# Patient Record
Sex: Female | Born: 1980 | Race: Black or African American | Hispanic: No | Marital: Single | Smoking: Never smoker
Health system: Southern US, Community
[De-identification: ages and names within clinical notes are randomized; demographics above are authoritative.]

## PROBLEM LIST (undated history)

## (undated) DIAGNOSIS — E669 Obesity, unspecified: Secondary | ICD-10-CM

## (undated) DIAGNOSIS — E079 Disorder of thyroid, unspecified: Secondary | ICD-10-CM

## (undated) DIAGNOSIS — D219 Benign neoplasm of connective and other soft tissue, unspecified: Secondary | ICD-10-CM

## (undated) DIAGNOSIS — N719 Inflammatory disease of uterus, unspecified: Secondary | ICD-10-CM

## (undated) DIAGNOSIS — J40 Bronchitis, not specified as acute or chronic: Secondary | ICD-10-CM

## (undated) HISTORY — PX: ABLATION: SHX5711

## (undated) HISTORY — PX: DILATION AND CURETTAGE OF UTERUS: SHX78

## (undated) HISTORY — PX: EYE SURGERY: SHX253

---

## 2001-01-09 ENCOUNTER — Other Ambulatory Visit: Admission: RE | Admit: 2001-01-09 | Discharge: 2001-01-09 | Payer: Self-pay | Admitting: Obstetrics and Gynecology

## 2001-03-06 ENCOUNTER — Encounter: Payer: Self-pay | Admitting: Obstetrics and Gynecology

## 2001-03-06 ENCOUNTER — Ambulatory Visit (HOSPITAL_COMMUNITY): Admission: RE | Admit: 2001-03-06 | Discharge: 2001-03-06 | Payer: Self-pay | Admitting: Obstetrics and Gynecology

## 2001-05-15 ENCOUNTER — Inpatient Hospital Stay (HOSPITAL_COMMUNITY): Admission: AD | Admit: 2001-05-15 | Discharge: 2001-05-15 | Payer: Self-pay | Admitting: Obstetrics and Gynecology

## 2001-08-03 ENCOUNTER — Encounter: Payer: Self-pay | Admitting: Obstetrics and Gynecology

## 2001-08-03 ENCOUNTER — Observation Stay (HOSPITAL_COMMUNITY): Admission: AD | Admit: 2001-08-03 | Discharge: 2001-08-04 | Payer: Self-pay | Admitting: Obstetrics and Gynecology

## 2001-08-04 ENCOUNTER — Encounter: Payer: Self-pay | Admitting: Obstetrics and Gynecology

## 2001-08-07 ENCOUNTER — Inpatient Hospital Stay (HOSPITAL_COMMUNITY): Admission: AD | Admit: 2001-08-07 | Discharge: 2001-08-07 | Payer: Self-pay | Admitting: Obstetrics and Gynecology

## 2001-08-10 ENCOUNTER — Inpatient Hospital Stay (HOSPITAL_COMMUNITY): Admission: AD | Admit: 2001-08-10 | Discharge: 2001-08-12 | Payer: Self-pay | Admitting: Obstetrics and Gynecology

## 2011-03-02 ENCOUNTER — Emergency Department (HOSPITAL_BASED_OUTPATIENT_CLINIC_OR_DEPARTMENT_OTHER)
Admission: EM | Admit: 2011-03-02 | Discharge: 2011-03-02 | Disposition: A | Discharge status: Home / Self Care | Payer: Self-pay | Attending: Emergency Medicine | Admitting: Emergency Medicine

## 2011-03-02 ENCOUNTER — Encounter: Payer: Self-pay | Admitting: Emergency Medicine

## 2011-03-02 ENCOUNTER — Emergency Department (INDEPENDENT_AMBULATORY_CARE_PROVIDER_SITE_OTHER): Payer: Self-pay

## 2011-03-02 DIAGNOSIS — J329 Chronic sinusitis, unspecified: Secondary | ICD-10-CM | POA: Insufficient documentation

## 2011-03-02 DIAGNOSIS — J3489 Other specified disorders of nose and nasal sinuses: Secondary | ICD-10-CM | POA: Insufficient documentation

## 2011-03-02 DIAGNOSIS — R509 Fever, unspecified: Secondary | ICD-10-CM | POA: Insufficient documentation

## 2011-03-02 DIAGNOSIS — R0989 Other specified symptoms and signs involving the circulatory and respiratory systems: Secondary | ICD-10-CM

## 2011-03-02 DIAGNOSIS — R05 Cough: Secondary | ICD-10-CM | POA: Insufficient documentation

## 2011-03-02 DIAGNOSIS — Z79899 Other long term (current) drug therapy: Secondary | ICD-10-CM | POA: Insufficient documentation

## 2011-03-02 DIAGNOSIS — R059 Cough, unspecified: Secondary | ICD-10-CM | POA: Insufficient documentation

## 2011-03-02 HISTORY — DX: Bronchitis, not specified as acute or chronic: J40

## 2011-03-02 MED ORDER — AMOXICILLIN 500 MG PO CAPS: 1000.0000 mg | ORAL_CAPSULE | Freq: Three times a day (TID) | ORAL | Status: AC

## 2011-03-02 NOTE — ED Notes (Signed)
Pt reports body aches, head ache, fever, chills and cough

## 2011-03-02 NOTE — ED Notes (Signed)
Pt. In no distress.

## 2011-03-03 NOTE — ED Provider Notes (Signed)
History     CSN: [id number] Arrival date & time: 03/02/2011  9:38 PM   First MD Initiated Contact with Patient 03/02/11 2220      Chief Complaint  Patient presents with  . Cough  . Nasal Congestion  . Headache  . Fever    (Consider location/radiation/quality/duration/timing/severity/associated sxs/prior treatment) HPI Comments: Patient presents with 3 weeks of cough, congestion, body aches, subjective fevers and chills. She does have facial pain, rhinorrhea, sore throat, body aches and diffuse headache. She denies any documented fevers, nausea, vomiting, abdominal pain. She denies any breath but this has chest pain with coughing. She's had multiple sick contacts at work. Taking over-the-counter cold and cough remedies without relief.  The history is provided by the patient.    Past Medical History  Diagnosis Date  . Bronchitis     Past Surgical History  Procedure Date  . Eye surgery     No family history on file.  History  Substance Use Topics  . Smoking status: Never Smoker   . Smokeless tobacco: Not on file  . Alcohol Use: No    OB History    Grav Para Term Preterm Abortions TAB SAB Ect Mult Living                  Review of Systems  Constitutional: Positive for fever.  Respiratory: Positive for cough.   Neurological: Positive for headaches.  All other systems reviewed and are negative.    Allergies  Review of patient's allergies indicates no known allergies.  Home Medications   Current Outpatient Rx  Name Route Sig Dispense Refill  . DEXTROMETHORPHAN-GUAIFENESIN 10-200 MG PO CAPS Oral Take 2 capsules by mouth every 6 (six) hours as needed. For congestion     . DEXTROMETHORPHAN-GUAIFENESIN 30-600 MG PO TB12 Oral Take 1 tablet by mouth every 12 (twelve) hours.      Marland Kitchen DIPHENHYDRAMINE HCL 25 MG PO TABS Oral Take 50 mg by mouth every 6 (six) hours as needed. For congestion     . PSEUDOEPHEDRINE HCL 30 MG PO TABS Oral Take 30 mg by mouth every 4 (four)  hours as needed. For congestion     . AMOXICILLIN 500 MG PO CAPS Oral Take 2 capsules (1,000 mg total) by mouth 3 (three) times daily. 60 capsule 0    BP 128/81  Pulse 97  Temp(Src) 99 F (37.2 C) (Oral)  Resp 18  Wt 250 lb (113.399 kg)  SpO2 98%  Physical Exam  Constitutional: She is oriented to person, place, and time. She appears well-developed and well-nourished. No distress.  HENT:  Head: Normocephalic and atraumatic.  Right Ear: External ear normal.  Left Ear: External ear normal.  Mouth/Throat: Oropharynx is clear and moist. No oropharyngeal exudate.       Maxillary and frontal sinus pain  Eyes: Conjunctivae are normal. Pupils are equal, round, and reactive to light.  Neck: Normal range of motion. Neck supple.       No meningismus  Cardiovascular: Normal rate, regular rhythm and normal heart sounds.   Pulmonary/Chest: Effort normal and breath sounds normal. No respiratory distress.  Abdominal: Soft. There is no tenderness. There is no rebound and no guarding.  Musculoskeletal: Normal range of motion. She exhibits no edema and no tenderness.  Neurological: She is alert and oriented to person, place, and time. No cranial nerve deficit.  Skin: Skin is warm.    ED Course  Procedures (including critical care time)   Labs Reviewed  RAPID STREP  SCREEN   Dg Chest 2 View  03/02/2011  *RADIOLOGY REPORT*  Clinical Data: Cough and congestion.  CHEST - 2 VIEW  Comparison: None.  Findings: The lungs are well-aerated and clear.  There is no evidence of focal opacification, pleural effusion or pneumothorax.  The heart is normal in size; the mediastinal contour is within normal limits.  No acute osseous abnormalities are seen.  IMPRESSION: No acute cardiopulmonary process seen.  Original Report Authenticated By: Tonia Ghent, M.D.     1. Sinusitis       MDM  Cough, congestion, facial pain subjective fever and chills for 3 weeks. Patient is nontoxic-appearing and exam but  does have congestion and voice change.   Given length of illness we'll treat for sinusitis. Stable for outpatient followup.        Glynn Octave, MD 03/03/11 0030

## 2014-12-11 ENCOUNTER — Emergency Department (HOSPITAL_BASED_OUTPATIENT_CLINIC_OR_DEPARTMENT_OTHER): Payer: Self-pay

## 2014-12-11 ENCOUNTER — Emergency Department (HOSPITAL_BASED_OUTPATIENT_CLINIC_OR_DEPARTMENT_OTHER)
Admission: EM | Admit: 2014-12-11 | Discharge: 2014-12-12 | Disposition: A | Discharge status: Home / Self Care | Payer: Self-pay | Attending: Emergency Medicine | Admitting: Emergency Medicine

## 2014-12-11 ENCOUNTER — Encounter (HOSPITAL_BASED_OUTPATIENT_CLINIC_OR_DEPARTMENT_OTHER): Payer: Self-pay | Admitting: Emergency Medicine

## 2014-12-11 DIAGNOSIS — S76112A Strain of left quadriceps muscle, fascia and tendon, initial encounter: Secondary | ICD-10-CM

## 2014-12-11 DIAGNOSIS — Y9389 Activity, other specified: Secondary | ICD-10-CM | POA: Insufficient documentation

## 2014-12-11 DIAGNOSIS — Y9289 Other specified places as the place of occurrence of the external cause: Secondary | ICD-10-CM | POA: Insufficient documentation

## 2014-12-11 DIAGNOSIS — Y998 Other external cause status: Secondary | ICD-10-CM | POA: Insufficient documentation

## 2014-12-11 DIAGNOSIS — Z8709 Personal history of other diseases of the respiratory system: Secondary | ICD-10-CM | POA: Insufficient documentation

## 2014-12-11 DIAGNOSIS — E669 Obesity, unspecified: Secondary | ICD-10-CM | POA: Insufficient documentation

## 2014-12-11 DIAGNOSIS — S73102A Unspecified sprain of left hip, initial encounter: Secondary | ICD-10-CM | POA: Insufficient documentation

## 2014-12-11 DIAGNOSIS — X58XXXA Exposure to other specified factors, initial encounter: Secondary | ICD-10-CM | POA: Insufficient documentation

## 2014-12-11 HISTORY — DX: Obesity, unspecified: E66.9

## 2014-12-11 NOTE — ED Notes (Signed)
Pt c/o left knee pain that started suddenly when getting out of car 45 min PTA

## 2014-12-11 NOTE — ED Notes (Signed)
C/o left knee pain onset getting out of car,  Hx of old knee inj to same leg

## 2014-12-12 MED ORDER — HYDROCODONE-ACETAMINOPHEN 5-325 MG PO TABS: 1.0000 | ORAL_TABLET | Freq: Four times a day (QID) | ORAL | Status: DC | PRN

## 2014-12-12 MED ORDER — NAPROXEN 250 MG PO TABS
500.0000 mg | ORAL_TABLET | Freq: Once | ORAL | Status: AC
  Administered 2014-12-12: 500 mg via ORAL
  Filled 2014-12-12: qty 2

## 2014-12-12 MED ORDER — HYDROCODONE-ACETAMINOPHEN 5-325 MG PO TABS
1.0000 | ORAL_TABLET | Freq: Once | ORAL | Status: AC
  Administered 2014-12-12: 1 via ORAL
  Filled 2014-12-12: qty 1

## 2014-12-12 MED ORDER — NAPROXEN SODIUM 275 MG PO TABS
ORAL_TABLET | ORAL | Status: DC
Start: 2014-12-12 — End: 2016-10-17

## 2014-12-12 NOTE — ED Provider Notes (Signed)
CSN: [id number]     Arrival date & time 12/11/14  2229 History   First MD Initiated Contact with Patient 12/12/14 [phone]     Chief Complaint  Patient presents with  . Knee Pain     (Consider location/radiation/quality/duration/timing/severity/associated sxs/prior Treatment) HPI  This is a 34 year old female who had the sudden onset of pain in her left knee and she was getting out of a car about 45 minutes prior to arrival. She felt no pop and there was no known trauma. The pain worsened and is now moderate to severe, especially when attempting to ambulate. The pain is located over the patella and distal quadriceps. There is no deformity or significant swelling associated. She denies other injury.  Past Medical History  Diagnosis Date  . Bronchitis   . Obesity    Past Surgical History  Procedure Laterality Date  . Eye surgery     No family history on file. Social History  Substance Use Topics  . Smoking status: Never Smoker   . Smokeless tobacco: None  . Alcohol Use: No   OB History    No data available     Review of Systems  All other systems reviewed and are negative.   Allergies  Review of patient's allergies indicates no known allergies.  Home Medications   Prior to Admission medications   Medication Sig Start Date End Date Taking? Authorizing Provider  Dextromethorphan-Guaifenesin (ALKA-SELTZER PLUS MUCUS & CONG) 10-200 MG CAPS Take 2 capsules by mouth every 6 (six) hours as needed. For congestion     Historical Provider, MD  dextromethorphan-guaiFENesin (MUCINEX DM) 30-600 MG per 12 hr tablet Take 1 tablet by mouth every 12 (twelve) hours.      Historical Provider, MD  diphenhydrAMINE (BENADRYL) 25 MG tablet Take 50 mg by mouth every 6 (six) hours as needed. For congestion     Historical Provider, MD  pseudoephedrine (SUDAFED) 30 MG tablet Take 30 mg by mouth every 4 (four) hours as needed. For congestion     Historical Provider, MD   BP 134/69 mmHg  Pulse 88   Temp(Src) 98.1 F (36.7 C) (Oral)  Resp 18  Ht 5' 8.5" (1.74 m)  Wt 250 lb (113.399 kg)  BMI 37.46 kg/m2  SpO2 96%   Physical Exam  General: Well-developed, well-nourished female in no acute distress; appearance consistent with age of record HENT: normocephalic; atraumatic Eyes: pupils equal, round and reactive to light; extraocular muscles intact Neck: supple Heart: regular rate and rhythm Lungs: clear to auscultation bilaterally Abdomen: soft; nondistended Extremities: No deformity; full range of motion except left knee; pulses normal; tenderness of left patella and distal quadriceps without swelling, ecchymosis or erythema; left quadriceps tendon function intact; left lower extremity distally neurovascularly intact Neurologic: Awake, alert and oriented; motor function intact in all extremities and symmetric; no facial droop Skin: Warm and dry Psychiatric: Normal mood and affect    ED Course  Procedures (including critical care time)   MDM  Nursing notes and vitals signs, including pulse oximetry, reviewed.  Summary of this visit's results, reviewed by myself:  Imaging Studies: Dg Knee Complete 4 Views Left  12/11/2014   CLINICAL DATA:  34 year old female with left knee pain  EXAM: LEFT KNEE - COMPLETE 4+ VIEW  COMPARISON:  None.  FINDINGS: There is no evidence of acute fracture, dislocation, or joint effusion. Small bony fragment adjacent to the intercondylar eminence is likely chronic. Clinical correlation is recommended. There is no evidence of arthropathy or other focal  bone abnormality. Soft tissues are unremarkable.  IMPRESSION: No acute fracture or dislocation. Small bony fragment adjacent to the intercondylar eminence of the tibia is likely chronic.   Electronically Signed   By: Anner Crete M.D.   On: 12/11/2014 23:29       Shanon Rosser, MD 12/12/14 [phone]

## 2015-02-16 ENCOUNTER — Emergency Department (HOSPITAL_BASED_OUTPATIENT_CLINIC_OR_DEPARTMENT_OTHER)
Admission: EM | Admit: 2015-02-16 | Discharge: 2015-02-16 | Disposition: A | Discharge status: Home / Self Care | Payer: Self-pay | Attending: Emergency Medicine | Admitting: Emergency Medicine

## 2015-02-16 ENCOUNTER — Encounter (HOSPITAL_BASED_OUTPATIENT_CLINIC_OR_DEPARTMENT_OTHER): Payer: Self-pay | Admitting: Emergency Medicine

## 2015-02-16 ENCOUNTER — Emergency Department (HOSPITAL_BASED_OUTPATIENT_CLINIC_OR_DEPARTMENT_OTHER): Payer: Self-pay

## 2015-02-16 DIAGNOSIS — Y998 Other external cause status: Secondary | ICD-10-CM | POA: Insufficient documentation

## 2015-02-16 DIAGNOSIS — Y9289 Other specified places as the place of occurrence of the external cause: Secondary | ICD-10-CM | POA: Insufficient documentation

## 2015-02-16 DIAGNOSIS — X58XXXA Exposure to other specified factors, initial encounter: Secondary | ICD-10-CM | POA: Insufficient documentation

## 2015-02-16 DIAGNOSIS — Z8709 Personal history of other diseases of the respiratory system: Secondary | ICD-10-CM | POA: Insufficient documentation

## 2015-02-16 DIAGNOSIS — E669 Obesity, unspecified: Secondary | ICD-10-CM | POA: Insufficient documentation

## 2015-02-16 DIAGNOSIS — Y9389 Activity, other specified: Secondary | ICD-10-CM | POA: Insufficient documentation

## 2015-02-16 DIAGNOSIS — Z79899 Other long term (current) drug therapy: Secondary | ICD-10-CM | POA: Insufficient documentation

## 2015-02-16 DIAGNOSIS — S93402A Sprain of unspecified ligament of left ankle, initial encounter: Secondary | ICD-10-CM | POA: Insufficient documentation

## 2015-02-16 NOTE — ED Notes (Signed)
Denies needs or questions, declines w/c

## 2015-02-16 NOTE — ED Notes (Signed)
EDPA into room, at BS.  

## 2015-02-16 NOTE — ED Notes (Signed)
Pt in c/o L ankle pain after twisting it today taking clothes into the house. Several injuries to the same ankle. Moderate swelling noted, pt ambulatory with steady gait.

## 2015-02-16 NOTE — ED Provider Notes (Signed)
CSN: [id number]     Arrival date & time 02/16/15  2133 History   First MD Initiated Contact with Patient 02/16/15 2142     Chief Complaint  Patient presents with  . Ankle Pain     (Consider location/radiation/quality/duration/timing/severity/associated sxs/prior Treatment) HPI  Blood pressure 112/93, pulse 88, temperature 98.1 F (36.7 C), temperature source Oral, resp. rate 18, height 5\' 8"  (1.727 m), weight 280 lb (127.007 kg), SpO2 98 %.  Destiny Hudson is a 34 y.o. female complaining of pain to left lateral ankle after she rolled it while carrying laundry into the house earlier in the day. Patient has a history of severe ankle sprain to the affected ankle, she sprained her knee on the left leg several months ago. She rates the pain as moderate to severe, 7 out of 10, no pain medication taken prior to arrival. She is able to bear weight on the ankle. She denies numbness, weakness. She never had surgery to the affected joint, does not follow with orthopedics.   Past Medical History  Diagnosis Date  . Bronchitis   . Obesity    Past Surgical History  Procedure Laterality Date  . Eye surgery     History reviewed. No pertinent family history. Social History  Substance Use Topics  . Smoking status: Never Smoker   . Smokeless tobacco: None  . Alcohol Use: No   OB History    No data available     Review of Systems  10 systems reviewed and found to be negative, except as noted in the HPI.  Allergies  Review of patient's allergies indicates no known allergies.  Home Medications   Prior to Admission medications   Medication Sig Start Date End Date Taking? Authorizing Provider  Dextromethorphan-Guaifenesin (ALKA-SELTZER PLUS MUCUS & CONG) 10-200 MG CAPS Take 2 capsules by mouth every 6 (six) hours as needed. For congestion     Historical Provider, MD  dextromethorphan-guaiFENesin (MUCINEX DM) 30-600 MG per 12 hr tablet Take 1 tablet by mouth every 12 (twelve) hours.       Historical Provider, MD  diphenhydrAMINE (BENADRYL) 25 MG tablet Take 50 mg by mouth every 6 (six) hours as needed. For congestion     Historical Provider, MD  HYDROcodone-acetaminophen (NORCO) 5-325 MG per tablet Take 1-2 tablets by mouth every 6 (six) hours as needed (for pain). 12/12/14   John Molpus, MD  naproxen sodium (ANAPROX) 275 MG tablet Take 1 tablet twice daily as needed for knee pain. Best taken with a meal. 12/12/14   Shanon Rosser, MD  pseudoephedrine (SUDAFED) 30 MG tablet Take 30 mg by mouth every 4 (four) hours as needed. For congestion     Historical Provider, MD   BP 112/93 mmHg  Pulse 88  Temp(Src) 98.1 F (36.7 C) (Oral)  Resp 18  Ht 5\' 8"  (1.727 m)  Wt 280 lb (127.007 kg)  BMI 42.58 kg/m2  SpO2 98% Physical Exam  Constitutional: She is oriented to person, place, and time. She appears well-developed and well-nourished. No distress.  HENT:  Head: Normocephalic.  Eyes: Conjunctivae and EOM are normal.  Cardiovascular: Normal rate.   Pulmonary/Chest: Effort normal. No stridor.  Musculoskeletal: Normal range of motion. She exhibits edema and tenderness.  Left ankle: No deformity, no overlying skin changes, mild swelling and tenderness to palpation along the inferior, lateral malleolus. No bony tenderness palpation, distally neurovascularly intact.   Neurological: She is alert and oriented to person, place, and time.  Psychiatric: She has a normal  mood and affect.  Nursing note and vitals reviewed.   ED Course  Procedures (including critical care time) Labs Review Labs Reviewed - No data to display  Imaging Review Dg Ankle Complete Left  02/16/2015  CLINICAL DATA:  Turned left ankle, with diffuse pain and swelling. Initial encounter. EXAM: LEFT ANKLE COMPLETE - 3+ VIEW COMPARISON:  Left ankle radiographs performed 08/03/2001 FINDINGS: There is no evidence of fracture or dislocation. The ankle mortise is intact; the interosseous space is within normal limits. No talar  tilt or subluxation is seen. Small plantar and posterior calcaneal spurs are seen. The joint spaces are preserved. Mild diffuse soft tissue swelling is noted about the ankle. IMPRESSION: No evidence of fracture or dislocation. Electronically Signed   By: Garald Balding M.D.   On: 02/16/2015 22:28   I have personally reviewed and evaluated these images and lab results as part of my medical decision-making.   EKG Interpretation None      MDM   Final diagnoses:  Left ankle sprain, initial encounter    Filed Vitals:   02/16/15 2141  BP: 112/93  Pulse: 88  Temp: 98.1 F (36.7 C)  TempSrc: Oral  Resp: 18  Height: 5\' 8"  (1.727 m)  Weight: 280 lb (127.007 kg)  SpO2: 98%    Destiny Hudson is 34 y.o. female presenting with left ankle pain. Neurovascularly intact with mild tenderness to palpation. X-rays negative. Will treat with rest, ice, compression elevation, NSAIDS. Patient has crutches at home and given an orthopedic follow-up.   Evaluation does not show pathology that would require ongoing emergent intervention or inpatient treatment. Pt is hemodynamically stable and mentating appropriately. Discussed findings and plan with patient/guardian, who agrees with care plan. All questions answered. Return precautions discussed and outpatient follow up given.       Monico Blitz, PA-C 02/16/15 [place], DO 02/16/15 2250

## 2015-02-16 NOTE — Discharge Instructions (Signed)
Use your crutches  Rest, Ice intermittently (in the first 24-48 hours), Gentle compression with an Ace wrap, and elevate (Limb above the level of the heart)   Take up to 800mg  of ibuprofen (that is usually 4 over the counter pills)  3 times a day for 5 days. Take with food.  Please follow with your primary care doctor in the next 2 days for a check-up. They must obtain records for further management.   Do not hesitate to return to the Emergency Department for any new, worsening or concerning symptoms.    Ankle Sprain An ankle sprain is an injury to the strong, fibrous tissues (ligaments) that hold the bones of your ankle joint together.  CAUSES An ankle sprain is usually caused by a fall or by twisting your ankle. Ankle sprains most commonly occur when you step on the outer edge of your foot, and your ankle turns inward. People who participate in sports are more prone to these types of injuries.  SYMPTOMS   Pain in your ankle. The pain may be present at rest or only when you are trying to stand or walk.  Swelling.  Bruising. Bruising may develop immediately or within 1 to 2 days after your injury.  Difficulty standing or walking, particularly when turning corners or changing directions. DIAGNOSIS  Your caregiver will ask you details about your injury and perform a physical exam of your ankle to determine if you have an ankle sprain. During the physical exam, your caregiver will press on and apply pressure to specific areas of your foot and ankle. Your caregiver will try to move your ankle in certain ways. An X-ray exam may be done to be sure a bone was not broken or a ligament did not separate from one of the bones in your ankle (avulsion fracture).  TREATMENT  Certain types of braces can help stabilize your ankle. Your caregiver can make a recommendation for this. Your caregiver may recommend the use of medicine for pain. If your sprain is severe, your caregiver may refer you to a surgeon  who helps to restore function to parts of your skeletal system (orthopedist) or a physical therapist. [place] ice to your injury for 1-2 days or as directed by your caregiver. Applying ice helps to reduce inflammation and pain.  Put ice in a plastic bag.  Place a towel between your skin and the bag.  Leave the ice on for 15-20 minutes at a time, every 2 hours while you are awake.  Only take over-the-counter or prescription medicines for pain, discomfort, or fever as directed by your caregiver.  Elevate your injured ankle above the level of your heart as much as possible for 2-3 days.  If your caregiver recommends crutches, use them as instructed. Gradually put weight on the affected ankle. Continue to use crutches or a cane until you can walk without feeling pain in your ankle.  If you have a plaster splint, wear the splint as directed by your caregiver. Do not rest it on anything harder than a pillow for the first 24 hours. Do not put weight on it. Do not get it wet. You may take it off to take a shower or bath.  You may have been given an elastic bandage to wear around your ankle to provide support. If the elastic bandage is too tight (you have numbness or tingling in your foot or your foot becomes cold and blue), adjust the bandage to make it  comfortable.  If you have an air splint, you may blow more air into it or let air out to make it more comfortable. You may take your splint off at night and before taking a shower or bath. Wiggle your toes in the splint several times per day to decrease swelling. SEEK MEDICAL CARE IF:   You have rapidly increasing bruising or swelling.  Your toes feel extremely cold or you lose feeling in your foot.  Your pain is not relieved with medicine. SEEK IMMEDIATE MEDICAL CARE IF:  Your toes are numb or blue.  You have severe pain that is increasing. MAKE SURE YOU:   Understand these instructions.  Will watch your  condition.  Will get help right away if you are not doing well or get worse.   This information is not intended to replace advice given to you by your health care provider. Make sure you discuss any questions you have with your health care provider.   Document Released: 03/22/2005 Document Revised: 04/12/2014 Document Reviewed: 04/03/2011 Elsevier Interactive Patient Education [employer].

## 2016-04-06 ENCOUNTER — Encounter (HOSPITAL_BASED_OUTPATIENT_CLINIC_OR_DEPARTMENT_OTHER): Payer: Self-pay | Admitting: Emergency Medicine

## 2016-04-06 ENCOUNTER — Emergency Department (HOSPITAL_BASED_OUTPATIENT_CLINIC_OR_DEPARTMENT_OTHER)
Admission: EM | Admit: 2016-04-06 | Discharge: 2016-04-07 | Disposition: A | Discharge status: Home / Self Care | Payer: PRIVATE HEALTH INSURANCE | Attending: Emergency Medicine | Admitting: Emergency Medicine

## 2016-04-06 DIAGNOSIS — N730 Acute parametritis and pelvic cellulitis: Secondary | ICD-10-CM

## 2016-04-06 DIAGNOSIS — R102 Pelvic and perineal pain: Secondary | ICD-10-CM | POA: Diagnosis present

## 2016-04-06 DIAGNOSIS — N739 Female pelvic inflammatory disease, unspecified: Secondary | ICD-10-CM | POA: Diagnosis not present

## 2016-04-06 HISTORY — DX: Inflammatory disease of uterus, unspecified: N71.9

## 2016-04-06 NOTE — ED Triage Notes (Signed)
Pt c/o pelvic pain and was dx with endometritis today at MD office. Pt states she has since been vomiting and having pelvic pain.

## 2016-04-06 NOTE — ED Provider Notes (Signed)
[facility] Provider Note   CSN: [id number] Arrival date & time: 04/06/16  1957   By signing my name below, I, Delton Prairie, attest that this documentation has been prepared under the direction and in the presence of Tanna Furry, MD  Electronically Signed: Delton Prairie, ED Scribe. 04/06/16. 12:10 AM.   History   Chief Complaint Chief Complaint  Patient presents with  . Pelvic Pain   The history is provided by the patient. No language interpreter was used.   HPI Comments:  Destiny Hudson is a 36 y.o. female who presents to the Emergency Department complaining of persistent lower pelvic pain x 2 days. Pt states her pain radiates down her bilateral lower extremities. She also reports nausea and vomiting. Her pain is worse upon palpation and with ambulation. Pt was seen on 04/07/15, was diagnosed with endometritis and prescribed antibiotics and tramadol. Pt denies any other associated symptoms and any other modifying factors at this time.   PCP: Lynder Parents, MD   Past Medical History:  Diagnosis Date  . Bronchitis   . Endometritis   . Obesity     There are no active problems to display for this patient.   Past Surgical History:  Procedure Laterality Date  . EYE SURGERY      OB History    No data available       Home Medications    Prior to Admission medications   Medication Sig Start Date End Date Taking? Authorizing Provider  Dextromethorphan-Guaifenesin (ALKA-SELTZER PLUS MUCUS & CONG) 10-200 MG CAPS Take 2 capsules by mouth every 6 (six) hours as needed. For congestion     Historical Provider, MD  dextromethorphan-guaiFENesin (MUCINEX DM) 30-600 MG per 12 hr tablet Take 1 tablet by mouth every 12 (twelve) hours.      Historical Provider, MD  diphenhydrAMINE (BENADRYL) 25 MG tablet Take 50 mg by mouth every 6 (six) hours as needed. For congestion     Historical Provider, MD  HYDROcodone-acetaminophen (NORCO) 5-325 MG per tablet Take 1-2 tablets by mouth  every 6 (six) hours as needed (for pain). 12/12/14   John Molpus, MD  naproxen sodium (ANAPROX) 275 MG tablet Take 1 tablet twice daily as needed for knee pain. Best taken with a meal. 12/12/14   John Molpus, MD  ondansetron (ZOFRAN ODT) 4 MG disintegrating tablet Take 1 tablet (4 mg total) by mouth every 8 (eight) hours as needed for nausea. 04/07/16   Tanna Furry, MD  pseudoephedrine (SUDAFED) 30 MG tablet Take 30 mg by mouth every 4 (four) hours as needed. For congestion     Historical Provider, MD    Family History No family history on file.  Social History Social History  Substance Use Topics  . Smoking status: Never Smoker  . Smokeless tobacco: Not on file  . Alcohol use No     Allergies   Patient has no known allergies.   Review of Systems Review of Systems  Constitutional: Negative for appetite change, chills, diaphoresis, fatigue and fever.  HENT: Negative for mouth sores, sore throat and trouble swallowing.   Eyes: Negative for visual disturbance.  Respiratory: Negative for cough, chest tightness, shortness of breath and wheezing.   Cardiovascular: Negative for chest pain.  Gastrointestinal: Positive for abdominal pain, nausea and vomiting. Negative for abdominal distention and diarrhea.  Endocrine: Negative for polydipsia, polyphagia and polyuria.  Genitourinary: Positive for pelvic pain. Negative for dysuria, frequency and hematuria.  Musculoskeletal: Positive for myalgias. Negative for gait problem.  Skin: Negative for color change, pallor and rash.  Neurological: Negative for dizziness, syncope, light-headedness and headaches.  Hematological: Does not bruise/bleed easily.  Psychiatric/Behavioral: Negative for behavioral problems and confusion.   Physical Exam Updated Vital Signs BP 135/80   Pulse 94   Temp 98.2 F (36.8 C) (Oral)   Resp 18   Ht 5\' 8"  (1.727 m)   Wt 290 lb (131.5 kg)   SpO2 96%   BMI 44.09 kg/m   Physical Exam  Constitutional: She is oriented  to person, place, and time. She appears well-developed and well-nourished. No distress.  HENT:  Head: Normocephalic.  Eyes: Conjunctivae are normal. Pupils are equal, round, and reactive to light. No scleral icterus.  Neck: Normal range of motion. Neck supple. No thyromegaly present.  Cardiovascular: Normal rate and regular rhythm.  Exam reveals no gallop and no friction rub.   No murmur heard. Pulmonary/Chest: Effort normal and breath sounds normal. No respiratory distress. She has no wheezes. She has no rales.  Abdominal: Soft. Bowel sounds are normal. She exhibits no distension. There is tenderness. There is no rebound.  Mild tenderness to suprapubic abdomen.   Musculoskeletal: Normal range of motion.  Neurological: She is alert and oriented to person, place, and time.  Skin: Skin is warm and dry. No rash noted.  Psychiatric: She has a normal mood and affect. Her behavior is normal.    ED Treatments / Results  DIAGNOSTIC STUDIES:  Oxygen Saturation is 99% on RA, normal by my interpretation.    COORDINATION OF CARE:  12:10 AM Discussed treatment plan with pt at bedside and pt agreed to plan.  Labs (all labs ordered are listed, but only abnormal results are displayed) Labs Reviewed - No data to display  EKG  EKG Interpretation None       Radiology No results found.  Procedures Procedures (including critical care time)  Medications Ordered in ED Medications  metroNIDAZOLE (FLAGYL) IVPB 500 mg (not administered)  morphine 4 MG/ML injection 4 mg (4 mg Intravenous Given 04/07/16 0042)  ondansetron (ZOFRAN) injection 4 mg (4 mg Intravenous Given 04/07/16 0042)  azithromycin (ZITHROMAX) 500 mg in dextrose 5 % 250 mL IVPB (0 mg Intravenous Stopped 04/07/16 0258)  sodium chloride 0.9 % bolus 1,000 mL (0 mLs Intravenous Stopped 04/07/16 0258)  azithromycin (ZITHROMAX) 500 MG injection (500 mg  Given 04/07/16 0042)     Initial Impression / Assessment and Plan / ED Course  I have  reviewed the triage vital signs and the nursing notes.  Pertinent labs & imaging results that were available during my care of the patient were reviewed by me and considered in my medical decision making (see chart for details).  Clinical Course     Given IV fluids. Given IV Flagyl and Zithromax. Take his by mouth liquids. Appropriate for discharge home. Continue her medications, doxycycline and Flagyl, as prescribed by her GYN. Zofran for nausea. Take medications with foods.  Final Clinical Impressions(s) / ED Diagnoses   Final diagnoses:  PID (acute pelvic inflammatory disease)    New Prescriptions New Prescriptions   ONDANSETRON (ZOFRAN ODT) 4 MG DISINTEGRATING TABLET    Take 1 tablet (4 mg total) by mouth every 8 (eight) hours as needed for nausea.  I personally performed the services described in this documentation, which was scribed in my presence. The recorded information has been reviewed and is accurate.     Tanna Furry, MD 04/07/16 [phone]

## 2016-04-06 NOTE — ED Notes (Signed)
Pt states she is unable to keep antibiotics down.

## 2016-04-07 MED ORDER — DEXTROSE 5 % IV SOLN
500.0000 mg | Freq: Once | INTRAVENOUS | Status: AC
  Administered 2016-04-07: 500 mg via INTRAVENOUS

## 2016-04-07 MED ORDER — MORPHINE SULFATE (PF) 4 MG/ML IV SOLN
4.0000 mg | INTRAVENOUS | Status: DC | PRN
Start: 2016-04-07 — End: 2016-04-07
  Administered 2016-04-07: 4 mg via INTRAVENOUS
  Filled 2016-04-07: qty 1

## 2016-04-07 MED ORDER — ONDANSETRON HCL 4 MG/2ML IJ SOLN
4.0000 mg | Freq: Once | INTRAMUSCULAR | Status: AC
  Administered 2016-04-07: 4 mg via INTRAVENOUS
  Filled 2016-04-07: qty 2

## 2016-04-07 MED ORDER — METRONIDAZOLE IN NACL 5-0.79 MG/ML-% IV SOLN
500.0000 mg | Freq: Once | INTRAVENOUS | Status: AC
  Administered 2016-04-07: 500 mg via INTRAVENOUS
  Filled 2016-04-07: qty 100

## 2016-04-07 MED ORDER — ONDANSETRON 4 MG PO TBDP: 4.0000 mg | ORAL_TABLET | Freq: Three times a day (TID) | ORAL | 0 refills | Status: DC | PRN

## 2016-04-07 MED ORDER — SODIUM CHLORIDE 0.9 % IV BOLUS (SEPSIS)
1000.0000 mL | Freq: Once | INTRAVENOUS | Status: AC
  Administered 2016-04-07: 1000 mL via INTRAVENOUS

## 2016-04-07 MED ORDER — AZITHROMYCIN 500 MG IV SOLR
INTRAVENOUS | Status: AC
  Administered 2016-04-07: 500 mg
  Filled 2016-04-07: qty 500

## 2016-04-07 NOTE — Discharge Instructions (Signed)
Continue your medications starting tomorrow as prescribed by your OB/GYN. Zofran for nausea. Absolutely no alcohol while taking Flagyl

## 2016-07-10 ENCOUNTER — Emergency Department (HOSPITAL_BASED_OUTPATIENT_CLINIC_OR_DEPARTMENT_OTHER): Payer: Self-pay

## 2016-07-10 ENCOUNTER — Encounter (HOSPITAL_BASED_OUTPATIENT_CLINIC_OR_DEPARTMENT_OTHER): Payer: Self-pay | Admitting: *Deleted

## 2016-07-10 ENCOUNTER — Emergency Department (HOSPITAL_BASED_OUTPATIENT_CLINIC_OR_DEPARTMENT_OTHER)
Admission: EM | Admit: 2016-07-10 | Discharge: 2016-07-10 | Disposition: A | Discharge status: Home / Self Care | Payer: Self-pay | Attending: Emergency Medicine | Admitting: Emergency Medicine

## 2016-07-10 DIAGNOSIS — N760 Acute vaginitis: Secondary | ICD-10-CM | POA: Insufficient documentation

## 2016-07-10 DIAGNOSIS — B9689 Other specified bacterial agents as the cause of diseases classified elsewhere: Secondary | ICD-10-CM

## 2016-07-10 DIAGNOSIS — E039 Hypothyroidism, unspecified: Secondary | ICD-10-CM | POA: Insufficient documentation

## 2016-07-10 DIAGNOSIS — R1032 Left lower quadrant pain: Secondary | ICD-10-CM

## 2016-07-10 HISTORY — DX: Disorder of thyroid, unspecified: E07.9

## 2016-07-10 LAB — BASIC METABOLIC PANEL
Anion gap: 7 (ref 5–15)
BUN: 9 mg/dL (ref 6–20)
CO2: 25 mmol/L (ref 22–32)
Calcium: 9 mg/dL (ref 8.9–10.3)
Chloride: 102 mmol/L (ref 101–111)
Creatinine, Ser: 0.95 mg/dL (ref 0.44–1.00)
GFR calc Af Amer: >60 mL/min (ref >60)
GFR calc non Af Amer: >60 mL/min (ref >60)
Glucose, Bld: 98 mg/dL (ref 65–99)
Potassium: 4.4 mmol/L (ref 3.5–5.1)
Sodium: 134 mmol/L — ABNORMAL LOW (ref 135–145)

## 2016-07-10 LAB — CBC WITH DIFFERENTIAL/PLATELET
Basophils Absolute: 0.1 10*3/uL (ref 0.0–0.1)
Basophils Relative: 0 %
Eosinophils Absolute: 0 10*3/uL (ref 0.0–0.7)
Eosinophils Relative: 0 %
HCT: 40.5 % (ref 36.0–46.0)
Hemoglobin: 13.4 g/dL (ref 12.0–15.0)
Lymphocytes Relative: 19 %
Lymphs Abs: 2.8 10*3/uL (ref 0.7–4.0)
MCH: 27 pg (ref 26.0–34.0)
MCHC: 33.1 g/dL (ref 30.0–36.0)
MCV: 81.5 fL (ref 78.0–100.0)
Monocytes Absolute: 0.8 10*3/uL (ref 0.1–1.0)
Monocytes Relative: 6 %
Neutro Abs: 10.8 10*3/uL — ABNORMAL HIGH (ref 1.7–7.7)
Neutrophils Relative %: 75 %
Platelets: 287 10*3/uL (ref 150–400)
RBC: 4.97 MIL/uL (ref 3.87–5.11)
RDW: 15 % (ref 11.5–15.5)
WBC: 14.6 10*3/uL — ABNORMAL HIGH (ref 4.0–10.5)

## 2016-07-10 LAB — URINALYSIS, ROUTINE W REFLEX MICROSCOPIC
BILIRUBIN URINE: NEGATIVE
GLUCOSE, UA: NEGATIVE mg/dL
HGB URINE DIPSTICK: NEGATIVE
KETONES UR: NEGATIVE mg/dL
LEUKOCYTES UA: NEGATIVE
Nitrite: NEGATIVE
PH: 7.5 (ref 5.0–8.0)
Protein, ur: NEGATIVE mg/dL
Specific Gravity, Urine: 1.021 (ref 1.005–1.030)

## 2016-07-10 LAB — WET PREP, GENITAL
Sperm: NONE SEEN
Trich, Wet Prep: NONE SEEN
Yeast Wet Prep HPF POC: NONE SEEN

## 2016-07-10 LAB — PREGNANCY, URINE: Preg Test, Ur: NEGATIVE

## 2016-07-10 MED ORDER — IBUPROFEN 800 MG PO TABS: 800.0000 mg | ORAL_TABLET | Freq: Three times a day (TID) | ORAL | 0 refills | Status: DC

## 2016-07-10 MED ORDER — METRONIDAZOLE 500 MG PO TABS: 500.0000 mg | ORAL_TABLET | Freq: Two times a day (BID) | ORAL | 0 refills | Status: DC

## 2016-07-10 NOTE — ED Provider Notes (Signed)
Patient care transferred at end of shift from [facility], PA-C pending U/S results. Plan is to discharge with BV treatment and follow-up with OB/GYN if ultrasound negative for abscess. [place] with acute abnormality. Patient was informed and advised to f/u with OB/Gyn and repeat imaging in 2-3 months as recommended by radiology. Patient was stable and ready to go home.   Emeline General, PA-C 07/10/16 [place], MD 07/10/16 [phone]

## 2016-07-10 NOTE — ED Provider Notes (Signed)
[facility] DEPT Provider Note   CSN: [id number] Arrival date & time: 07/10/16  1133     History   Chief Complaint Chief Complaint  Patient presents with  . Back Pain  . Abdominal Pain    HPI Destiny Hudson is a 36 y.o. female with history of uterine fibroids, endometritis who presents with intermittent left lower quadrant pain accompanied with vaginal bleeding for the past week. Patient has history of endometrial ablation had not had a period around a year. Patient began with spotting over the past week and had large clots yesterday. Patient had severe left lower quadrant pain at that time. When she has her pain, she has pain radiating to her left lower extremity and left side low back. She does not have any pain at this time. She denies any concern for STD exposure. She denies any other abnormal vaginal discharge or pelvic pain. Followed by an OB/GYN who has managed this in the past. Patient states her symptoms feel the same as in the past when she was diagnosed with uterine fibroids. Patient denies any fevers, chest pain, shortness of breath, other abdominal pain, nausea, vomiting, urinary symptoms.  HPI  Past Medical History:  Diagnosis Date  . Bronchitis   . Endometritis   . Obesity   . Thyroid disease    Hypo    There are no active problems to display for this patient.   Past Surgical History:  Procedure Laterality Date  . ABLATION     uterus  . DILATION AND CURETTAGE OF UTERUS    . EYE SURGERY      OB History    No data available       Home Medications    Prior to Admission medications   Medication Sig Start Date End Date Taking? Authorizing Provider  Levothyroxine Sodium (SYNTHROID PO) Take by mouth.   Yes Historical Provider, MD  Dextromethorphan-Guaifenesin (ALKA-SELTZER PLUS MUCUS & CONG) 10-200 MG CAPS Take 2 capsules by mouth every 6 (six) hours as needed. For congestion     Historical Provider, MD  dextromethorphan-guaiFENesin (MUCINEX DM) 30-600  MG per 12 hr tablet Take 1 tablet by mouth every 12 (twelve) hours.      Historical Provider, MD  diphenhydrAMINE (BENADRYL) 25 MG tablet Take 50 mg by mouth every 6 (six) hours as needed. For congestion     Historical Provider, MD  HYDROcodone-acetaminophen (NORCO) 5-325 MG per tablet Take 1-2 tablets by mouth every 6 (six) hours as needed (for pain). 12/12/14   John Molpus, MD  ibuprofen (ADVIL,MOTRIN) 800 MG tablet Take 1 tablet (800 mg total) by mouth 3 (three) times daily. 07/10/16   Frederica Kuster, PA-C  metroNIDAZOLE (FLAGYL) 500 MG tablet Take 1 tablet (500 mg total) by mouth 2 (two) times daily. 07/10/16   Frederica Kuster, PA-C  naproxen sodium (ANAPROX) 275 MG tablet Take 1 tablet twice daily as needed for knee pain. Best taken with a meal. 12/12/14   John Molpus, MD  ondansetron (ZOFRAN ODT) 4 MG disintegrating tablet Take 1 tablet (4 mg total) by mouth every 8 (eight) hours as needed for nausea. 04/07/16   Tanna Furry, MD  pseudoephedrine (SUDAFED) 30 MG tablet Take 30 mg by mouth every 4 (four) hours as needed. For congestion     Historical Provider, MD    Family History No family history on file.  Social History Social History  Substance Use Topics  . Smoking status: Never Smoker  . Smokeless tobacco: Never Used  .  Alcohol use No     Allergies   Patient has no known allergies.   Review of Systems Review of Systems  Constitutional: Negative for chills and fever.  HENT: Negative for facial swelling and sore throat.   Respiratory: Negative for shortness of breath.   Cardiovascular: Negative for chest pain.  Gastrointestinal: Positive for abdominal pain. Negative for nausea and vomiting.  Genitourinary: Positive for vaginal bleeding. Negative for dysuria, frequency, urgency, vaginal discharge and vaginal pain.  Musculoskeletal: Positive for back pain.  Skin: Negative for rash and wound.  Neurological: Negative for headaches.  Psychiatric/Behavioral: The patient is not  nervous/anxious.      Physical Exam Updated Vital Signs BP (!) 140/95 (BP Location: Right Arm)   Pulse 82   Temp 98.5 F (36.9 C) (Oral)   Resp 18   Ht 5\' 10"  (1.778 m)   Wt 129.3 kg   SpO2 100%   BMI 40.89 kg/m   Physical Exam  Constitutional: She appears well-developed and well-nourished. No distress.  HENT:  Head: Normocephalic and atraumatic.  Mouth/Throat: Oropharynx is clear and moist. No oropharyngeal exudate.  Eyes: Conjunctivae are normal. Pupils are equal, round, and reactive to light. Right eye exhibits no discharge. Left eye exhibits no discharge. No scleral icterus.  Neck: Normal range of motion. Neck supple. No thyromegaly present.  Cardiovascular: Normal rate, regular rhythm, normal heart sounds and intact distal pulses.  Exam reveals no gallop and no friction rub.   No murmur heard. Pulmonary/Chest: Effort normal and breath sounds normal. No stridor. No respiratory distress. She has no wheezes. She has no rales.  Abdominal: Soft. Bowel sounds are normal. She exhibits no distension. There is no tenderness. There is no rebound and no guarding.  Genitourinary: Uterus normal. Pelvic exam was performed with patient supine. Cervix exhibits no motion tenderness. Right adnexum displays no mass, no tenderness and no fullness. Left adnexum displays no mass, no tenderness and no fullness. Vaginal discharge (white/clear) found.  Genitourinary Comments: Chaperone present  Musculoskeletal: She exhibits no edema.  Lymphadenopathy:    She has no cervical adenopathy.  Neurological: She is alert. Coordination normal.  Skin: Skin is warm and dry. No rash noted. She is not diaphoretic. No pallor.  Psychiatric: She has a normal mood and affect.  Nursing note and vitals reviewed.    ED Treatments / Results  Labs (all labs ordered are listed, but only abnormal results are displayed) Labs Reviewed  WET PREP, GENITAL - Abnormal; Notable for the following:       Result Value    Clue Cells Wet Prep HPF POC PRESENT (*)    WBC, Wet Prep HPF POC MANY (*)    All other components within normal limits  CBC WITH DIFFERENTIAL/PLATELET - Abnormal; Notable for the following:    WBC 14.6 (*)    Neutro Abs 10.8 (*)    All other components within normal limits  BASIC METABOLIC PANEL - Abnormal; Notable for the following:    Sodium 134 (*)    All other components within normal limits  PREGNANCY, URINE  URINALYSIS, ROUTINE W REFLEX MICROSCOPIC  GC/CHLAMYDIA PROBE AMP () NOT AT [facility]    EKG  EKG Interpretation None       Radiology US Transvaginal Non-ob  Result Date: 07/10/2016 CLINICAL DATA:  Left lower quadrant pain for 1 week, intermittent with nausea. History of tubal ligation and endometrial a ablation knee in 2017. EXAM: TRANSABDOMINAL AND TRANSVAGINAL ULTRASOUND OF PELVIS DOPPLER ULTRASOUND OF OVARIES TECHNIQUE: Both  transabdominal and transvaginal ultrasound examinations of the pelvis were performed. Transabdominal technique was performed for global imaging of the pelvis including uterus, ovaries, adnexal regions, and pelvic cul-de-sac. It was necessary to proceed with endovaginal exam following the transabdominal exam to visualize the endometrial complex and adnexal structures to an adequate degree. Color and duplex Doppler ultrasound was utilized to evaluate blood flow to the ovaries. COMPARISON:  None. FINDINGS: Uterus Measurements: 10.8 x 5.9 x 8 cm. Heterogeneous echotexture throughout without discrete mass or focal lesion. Endometrium Thickness: Approximately 7 mm thickness. Irregular margins without discrete mass. At least some component of fluid and/or debris within the endometrial canal. Right ovary Measurements: 3.9 x 2.5 x 2.8 cm. Normal dominant follicle measuring 2.6 cm. Left ovary Measurements: 2.4 x 1.4 x 2.1 cm. Normal appearance/no adnexal mass. Pulsed Doppler evaluation of both ovaries demonstrates normal low-resistance arterial and venous  waveforms. Other findings No abnormal free fluid. IMPRESSION: 1. Endometrial complex with irregular margins, but without discrete mass, presumably related to history of previous endometrial ablation. Small amount of fluid and/or debris within the upper portion of the endometrial canal, again possibly residual from prior ablation. Overall endometrial thickness is within normal limits at 7 mm. Would consider follow-up pelvic ultrasound in 2-3 months to ensure stability. 2. Both ovaries appear normal and there is no mass or free fluid seen within either adnexal region. Electronically Signed   By: Franki Cabot M.D.   On: 07/10/2016 17:13   US Pelvis Complete  Result Date: 07/10/2016 CLINICAL DATA:  Left lower quadrant pain for 1 week, intermittent with nausea. History of tubal ligation and endometrial a ablation knee in 2017. EXAM: TRANSABDOMINAL AND TRANSVAGINAL ULTRASOUND OF PELVIS DOPPLER ULTRASOUND OF OVARIES TECHNIQUE: Both transabdominal and transvaginal ultrasound examinations of the pelvis were performed. Transabdominal technique was performed for global imaging of the pelvis including uterus, ovaries, adnexal regions, and pelvic cul-de-sac. It was necessary to proceed with endovaginal exam following the transabdominal exam to visualize the endometrial complex and adnexal structures to an adequate degree. Color and duplex Doppler ultrasound was utilized to evaluate blood flow to the ovaries. COMPARISON:  None. FINDINGS: Uterus Measurements: 10.8 x 5.9 x 8 cm. Heterogeneous echotexture throughout without discrete mass or focal lesion. Endometrium Thickness: Approximately 7 mm thickness. Irregular margins without discrete mass. At least some component of fluid and/or debris within the endometrial canal. Right ovary Measurements: 3.9 x 2.5 x 2.8 cm. Normal dominant follicle measuring 2.6 cm. Left ovary Measurements: 2.4 x 1.4 x 2.1 cm. Normal appearance/no adnexal mass. Pulsed Doppler evaluation of both ovaries  demonstrates normal low-resistance arterial and venous waveforms. Other findings No abnormal free fluid. IMPRESSION: 1. Endometrial complex with irregular margins, but without discrete mass, presumably related to history of previous endometrial ablation. Small amount of fluid and/or debris within the upper portion of the endometrial canal, again possibly residual from prior ablation. Overall endometrial thickness is within normal limits at 7 mm. Would consider follow-up pelvic ultrasound in 2-3 months to ensure stability. 2. Both ovaries appear normal and there is no mass or free fluid seen within either adnexal region. Electronically Signed   By: Franki Cabot M.D.   On: 07/10/2016 17:13   [place] Art/ven Flow Abd Pelv Doppler  Result Date: 07/10/2016 CLINICAL DATA:  Left lower quadrant pain for 1 week, intermittent with nausea. History of tubal ligation and endometrial a ablation knee in 2017. EXAM: TRANSABDOMINAL AND TRANSVAGINAL ULTRASOUND OF PELVIS DOPPLER ULTRASOUND OF OVARIES TECHNIQUE: Both transabdominal and transvaginal ultrasound examinations  of the pelvis were performed. Transabdominal technique was performed for global imaging of the pelvis including uterus, ovaries, adnexal regions, and pelvic cul-de-sac. It was necessary to proceed with endovaginal exam following the transabdominal exam to visualize the endometrial complex and adnexal structures to an adequate degree. Color and duplex Doppler ultrasound was utilized to evaluate blood flow to the ovaries. COMPARISON:  None. FINDINGS: Uterus Measurements: 10.8 x 5.9 x 8 cm. Heterogeneous echotexture throughout without discrete mass or focal lesion. Endometrium Thickness: Approximately 7 mm thickness. Irregular margins without discrete mass. At least some component of fluid and/or debris within the endometrial canal. Right ovary Measurements: 3.9 x 2.5 x 2.8 cm. Normal dominant follicle measuring 2.6 cm. Left ovary Measurements: 2.4 x 1.4 x 2.1 cm. Normal  appearance/no adnexal mass. Pulsed Doppler evaluation of both ovaries demonstrates normal low-resistance arterial and venous waveforms. Other findings No abnormal free fluid. IMPRESSION: 1. Endometrial complex with irregular margins, but without discrete mass, presumably related to history of previous endometrial ablation. Small amount of fluid and/or debris within the upper portion of the endometrial canal, again possibly residual from prior ablation. Overall endometrial thickness is within normal limits at 7 mm. Would consider follow-up pelvic ultrasound in 2-3 months to ensure stability. 2. Both ovaries appear normal and there is no mass or free fluid seen within either adnexal region. Electronically Signed   By: Franki Cabot M.D.   On: 07/10/2016 17:13    Procedures Procedures (including critical care time)  Medications Ordered in ED Medications - No data to display   Initial Impression / Assessment and Plan / ED Course  I have reviewed the triage vital signs and the nursing notes.  Pertinent labs & imaging results that were available during my care of the patient were reviewed by me and considered in my medical decision making (see chart for details).     Patient with recurrent pelvic pain. Pelvic ultrasound shows [1. Endometrial complex with irregular margins, but without discrete mass, presumably related to history of previous endometrial ablation. Small amount of fluid and/or debris within the upper  portion of the endometrial canal, again possibly residual from prior ablation. Overall endometrial thickness is within normal limits at 7 mm. Would consider follow-up pelvic ultrasound in 2-3 months to ensure stability. 2. Both ovaries appear normal and there is no mass or free fluid seen within either adnexal region.] CBC shows WBC 14.6, which could be reactive from severe pain episode prior to blood draw, no abscess found on ultrasound. BMP shows sodium 134. Urine negative, urine pregnancy  negative. Wet prep shows clue cells and many WBCs. GC/Chlamydia sent. HIV and syphilis declined by patient. We'll treat with metronidazole with follow-up to OB/GYN this week. We'll also treat pain with ibuprofen. Return precautions discussed. Patient understands and agrees with plan. Patient vitals stable throughout ED course and discharged in satisfactory condition.  Final Clinical Impressions(s) / ED Diagnoses   Final diagnoses:  LLQ pain  Bacterial vaginosis    New Prescriptions Discharge Medication List as of 07/10/2016  5:22 PM    START taking these medications   Details  ibuprofen (ADVIL,MOTRIN) 800 MG tablet Take 1 tablet (800 mg total) by mouth 3 (three) times daily., Starting Sat 07/10/2016, Print    metroNIDAZOLE (FLAGYL) 500 MG tablet Take 1 tablet (500 mg total) by mouth 2 (two) times daily., Starting Sat 07/10/2016, Print         Frederica Kuster, PA-C 07/11/16 0932    Alfonzo Beers, MD 07/11/16 [phone]

## 2016-07-10 NOTE — ED Notes (Signed)
Attempted to get vitals. Pt in ultra sound. Will attempt again when pt returns.

## 2016-07-10 NOTE — ED Triage Notes (Signed)
Patient states she has a one week history of RLQ abdominal pain with radiation into the right hip and thigh.  States the pain starts in her lower back and radiates into RLQ of abdomen.  History uterine ablation and fibroids.

## 2016-07-10 NOTE — Discharge Instructions (Addendum)
Medications: ibuprofen, Flagyl  Treatment: Take ibuprofen every 8 hours as needed for your pain. You can alternate with Tylenol as prescribed over-the-counter. Take Flagyl twice daily for a week. Make sure not to drink alcohol with this medication.  Follow-up: Please follow-up with your OB/GYN this is possible for further evaluation and treatment of your symptoms. Please return to the emergency department if you develop any new or worsening symptoms.  Radiology report from pelvic US: Would consider follow-up pelvic ultrasound in 2-3 months to  ensure stability.

## 2016-07-10 NOTE — ED Notes (Signed)
Pt discharged to home with family. NAD.  

## 2016-07-12 LAB — GC/CHLAMYDIA PROBE AMP (~~LOC~~) NOT AT ARMC
Chlamydia: NEGATIVE
Neisseria Gonorrhea: NEGATIVE

## 2016-10-17 ENCOUNTER — Encounter (HOSPITAL_BASED_OUTPATIENT_CLINIC_OR_DEPARTMENT_OTHER): Payer: Self-pay | Admitting: *Deleted

## 2016-10-17 ENCOUNTER — Emergency Department (HOSPITAL_BASED_OUTPATIENT_CLINIC_OR_DEPARTMENT_OTHER)
Admission: EM | Admit: 2016-10-17 | Discharge: 2016-10-17 | Disposition: A | Discharge status: Home / Self Care | Payer: PRIVATE HEALTH INSURANCE | Attending: Emergency Medicine | Admitting: Emergency Medicine

## 2016-10-17 DIAGNOSIS — R102 Pelvic and perineal pain: Secondary | ICD-10-CM

## 2016-10-17 DIAGNOSIS — N946 Dysmenorrhea, unspecified: Secondary | ICD-10-CM | POA: Insufficient documentation

## 2016-10-17 DIAGNOSIS — E039 Hypothyroidism, unspecified: Secondary | ICD-10-CM | POA: Insufficient documentation

## 2016-10-17 LAB — BASIC METABOLIC PANEL
ANION GAP: 9 (ref 5–15)
BUN: 8 mg/dL (ref 6–20)
CO2: 24 mmol/L (ref 22–32)
Calcium: 8.7 mg/dL — ABNORMAL LOW (ref 8.9–10.3)
Chloride: 103 mmol/L (ref 101–111)
Creatinine, Ser: 0.95 mg/dL (ref 0.44–1.00)
GLUCOSE: 122 mg/dL — AB (ref 65–99)
POTASSIUM: 3.8 mmol/L (ref 3.5–5.1)
Sodium: 136 mmol/L (ref 135–145)

## 2016-10-17 LAB — CBC WITH DIFFERENTIAL/PLATELET
BASOS ABS: 0 10*3/uL (ref 0.0–0.1)
BASOS PCT: 0 %
Eosinophils Absolute: 0.1 10*3/uL (ref 0.0–0.7)
Eosinophils Relative: 1 %
HEMATOCRIT: 38.1 % (ref 36.0–46.0)
Hemoglobin: 12.5 g/dL (ref 12.0–15.0)
LYMPHS PCT: 16 %
Lymphs Abs: 2.1 10*3/uL (ref 0.7–4.0)
MCH: 26.8 pg (ref 26.0–34.0)
MCHC: 32.8 g/dL (ref 30.0–36.0)
MCV: 81.8 fL (ref 78.0–100.0)
Monocytes Absolute: 0.7 10*3/uL (ref 0.1–1.0)
Monocytes Relative: 5 %
NEUTROS ABS: 9.9 10*3/uL — AB (ref 1.7–7.7)
Neutrophils Relative %: 78 %
PLATELETS: 218 10*3/uL (ref 150–400)
RBC: 4.66 MIL/uL (ref 3.87–5.11)
RDW: 14.6 % (ref 11.5–15.5)
WBC: 12.8 10*3/uL — AB (ref 4.0–10.5)

## 2016-10-17 LAB — URINALYSIS, ROUTINE W REFLEX MICROSCOPIC
BILIRUBIN URINE: NEGATIVE
GLUCOSE, UA: NEGATIVE mg/dL
Ketones, ur: 15 mg/dL — AB
Nitrite: NEGATIVE
PH: 7 (ref 5.0–8.0)
PROTEIN: NEGATIVE mg/dL
Specific Gravity, Urine: 1.018 (ref 1.005–1.030)

## 2016-10-17 LAB — URINALYSIS, MICROSCOPIC (REFLEX)

## 2016-10-17 LAB — PREGNANCY, URINE: PREG TEST UR: NEGATIVE

## 2016-10-17 MED ORDER — DOXYCYCLINE HYCLATE 100 MG PO CAPS: 100.0000 mg | ORAL_CAPSULE | Freq: Two times a day (BID) | ORAL | 0 refills | Status: DC

## 2016-10-17 MED ORDER — KETOROLAC TROMETHAMINE 30 MG/ML IJ SOLN
30.0000 mg | Freq: Once | INTRAMUSCULAR | Status: AC
  Administered 2016-10-17: 30 mg via INTRAVENOUS
  Filled 2016-10-17: qty 1

## 2016-10-17 MED ORDER — HYDROCODONE-ACETAMINOPHEN 5-325 MG PO TABS: 1.0000 | ORAL_TABLET | ORAL | 0 refills | Status: DC | PRN

## 2016-10-17 MED ORDER — HYDROMORPHONE HCL 1 MG/ML IJ SOLN
1.0000 mg | Freq: Once | INTRAMUSCULAR | Status: AC
  Administered 2016-10-17: 1 mg via INTRAVENOUS
  Filled 2016-10-17: qty 1

## 2016-10-17 MED ORDER — SODIUM CHLORIDE 0.9 % IV BOLUS (SEPSIS)
1000.0000 mL | Freq: Once | INTRAVENOUS | Status: AC
  Administered 2016-10-17: 1000 mL via INTRAVENOUS

## 2016-10-17 MED ORDER — ONDANSETRON HCL 4 MG/2ML IJ SOLN
4.0000 mg | Freq: Once | INTRAMUSCULAR | Status: AC
  Administered 2016-10-17: 4 mg via INTRAVENOUS
  Filled 2016-10-17: qty 2

## 2016-10-17 MED ORDER — METRONIDAZOLE 500 MG PO TABS: 500.0000 mg | ORAL_TABLET | Freq: Three times a day (TID) | ORAL | 0 refills | Status: DC

## 2016-10-17 NOTE — ED Triage Notes (Signed)
C/o left lower quad that started yesterday. States she has a hx of fibroids and this happens when she has her menstrual cycle. States she had an ablation for her periods but states she recently started having them again. Denies any diarrhea. C/o vomiting 3-4 times in the last 2 hours. Describes pain as sharp that's comes and goes.

## 2016-10-17 NOTE — ED Provider Notes (Signed)
[facility] Provider Note   CSN: [id number] Arrival date & time: 10/17/16  0115     History   Chief Complaint Chief Complaint  Patient presents with  . Abdominal Pain    HPI Destiny Hudson is a 36 y.o. female.  Patient presents to the ER for evaluation of left lower abdomen and pelvic pain. Symptoms ongoing for 2 days. Patient reports that she has had recurrent symptoms of pelvic pain, nausea, vomiting with her menstrual cycle over the last year or so. She had previously had an endometrial ablation ceasing her menstruation, but over the last year and started having bleeding again with the symptoms. Her OB/GYN doctor, Dr. Shelly Flatten has discussed the possibility of hysterectomy with her.      Past Medical History:  Diagnosis Date  . Bronchitis   . Endometritis   . Obesity   . Thyroid disease    Hypo    There are no active problems to display for this patient.   Past Surgical History:  Procedure Laterality Date  . ABLATION     uterus  . DILATION AND CURETTAGE OF UTERUS    . EYE SURGERY      OB History    No data available       Home Medications    Prior to Admission medications   Medication Sig Start Date End Date Taking? Authorizing Provider  TRAMADOL HCL ER PO Take by mouth.   Yes [provider]  doxycycline (VIBRAMYCIN) 100 MG capsule Take 1 capsule (100 mg total) by mouth 2 (two) times daily. 10/17/16   Orpah Greek, MD  HYDROcodone-acetaminophen (NORCO/VICODIN) 5-325 MG tablet Take 1-2 tablets by mouth every 4 (four) hours as needed for moderate pain. 10/17/16   Orpah Greek, MD  metroNIDAZOLE (FLAGYL) 500 MG tablet Take 1 tablet (500 mg total) by mouth 3 (three) times daily. 10/17/16   Orpah Greek, MD    Family History No family history on file.  Social History Social History  Substance Use Topics  . Smoking status: Never Smoker  . Smokeless tobacco: Never Used  . Alcohol use No     Allergies     Patient has no known allergies.   Review of Systems Review of Systems  Gastrointestinal: Positive for nausea and vomiting.  Genitourinary: Positive for pelvic pain.  All other systems reviewed and are negative.    Physical Exam Updated Vital Signs BP 137/87   Pulse 85   Temp 98 F (36.7 C) (Oral)   Resp (!) 22   Ht 5\' 10"  (1.778 m)   Wt 117.9 kg (260 lb)   SpO2 92%   BMI 37.31 kg/m   Physical Exam  Constitutional: She is oriented to person, place, and time. She appears well-developed and well-nourished. No distress.  HENT:  Head: Normocephalic and atraumatic.  Right Ear: Hearing normal.  Left Ear: Hearing normal.  Nose: Nose normal.  Mouth/Throat: Oropharynx is clear and moist and mucous membranes are normal.  Eyes: Pupils are equal, round, and reactive to light. Conjunctivae and EOM are normal.  Neck: Normal range of motion. Neck supple.  Cardiovascular: Regular rhythm, S1 normal and S2 normal.  Exam reveals no gallop and no friction rub.   No murmur heard. Pulmonary/Chest: Effort normal and breath sounds normal. No respiratory distress. She exhibits no tenderness.  Abdominal: Soft. Normal appearance and bowel sounds are normal. There is no hepatosplenomegaly. There is tenderness in the left lower quadrant. There is no rebound, no  guarding, no tenderness at McBurney's point and negative Murphy's sign. No hernia.  Musculoskeletal: Normal range of motion.  Neurological: She is alert and oriented to person, place, and time. She has normal strength. No cranial nerve deficit or sensory deficit. Coordination normal. GCS eye subscore is 4. GCS verbal subscore is 5. GCS motor subscore is 6.  Skin: Skin is warm, dry and intact. No rash noted. No cyanosis.  Psychiatric: She has a normal mood and affect. Her speech is normal and behavior is normal. Thought content normal.  Nursing note and vitals reviewed.    ED Treatments / Results  Labs (all labs ordered are listed, but only  abnormal results are displayed) Labs Reviewed  URINALYSIS, ROUTINE W REFLEX MICROSCOPIC - Abnormal; Notable for the following:       Result Value   APPearance CLOUDY (*)    Hgb urine dipstick LARGE (*)    Ketones, ur 15 (*)    Leukocytes, UA TRACE (*)    All other components within normal limits  CBC WITH DIFFERENTIAL/PLATELET - Abnormal; Notable for the following:    WBC 12.8 (*)    Neutro Abs 9.9 (*)    All other components within normal limits  BASIC METABOLIC PANEL - Abnormal; Notable for the following:    Glucose, Bld 122 (*)    Calcium 8.7 (*)    All other components within normal limits  URINALYSIS, MICROSCOPIC (REFLEX) - Abnormal; Notable for the following:    Bacteria, UA RARE (*)    Squamous Epithelial / LPF 0-5 (*)    All other components within normal limits  PREGNANCY, URINE    EKG  EKG Interpretation None       Radiology No results found.  Procedures Procedures (including critical care time)  Medications Ordered in ED Medications  sodium chloride 0.9 % bolus 1,000 mL (1,000 mLs Intravenous New Bag/Given 10/17/16 0204)  HYDROmorphone (DILAUDID) injection 1 mg (1 mg Intravenous Given 10/17/16 0204)  ondansetron (ZOFRAN) injection 4 mg (4 mg Intravenous Given 10/17/16 0204)     Initial Impression / Assessment and Plan / ED Course  I have reviewed the triage vital signs and the nursing notes.  Pertinent labs & imaging results that were available during my care of the patient were reviewed by me and considered in my medical decision making (see chart for details).     Patient presents to the emergency department for evaluation of pelvic pain. Patient has been seen multiple times in this ER and by her OB/GYN for similar symptoms. She has left lower quadrant and pelvic pain similar to prior examinations. She has had nausea and vomiting similar to prior presentations as well. Examination is not consistent with peritonitis. Patient to minister to IV fluids,  antiemetics and pain medication. Her OB/GYN history her for endometritis in the past for similar presentation. Will repeat the Flagyl and doxycycline that she has been given in the past, analgesia, prompt follow-up with OB/GYN. As she has had identical presentations in the past, no imaging is necessary.  Final Clinical Impressions(s) / ED Diagnoses   Final diagnoses:  Pelvic pain  Dysmenorrhea    New Prescriptions New Prescriptions   DOXYCYCLINE (VIBRAMYCIN) 100 MG CAPSULE    Take 1 capsule (100 mg total) by mouth 2 (two) times daily.   HYDROCODONE-ACETAMINOPHEN (NORCO/VICODIN) 5-325 MG TABLET    Take 1-2 tablets by mouth every 4 (four) hours as needed for moderate pain.   METRONIDAZOLE (FLAGYL) 500 MG TABLET    Take 1  tablet (500 mg total) by mouth 3 (three) times daily.     Orpah Greek, MD 10/17/16 [phone]

## 2016-10-17 NOTE — ED Notes (Signed)
Pt's ride arrived

## 2016-12-21 ENCOUNTER — Emergency Department (HOSPITAL_BASED_OUTPATIENT_CLINIC_OR_DEPARTMENT_OTHER)
Admission: EM | Admit: 2016-12-21 | Discharge: 2016-12-22 | Disposition: A | Discharge status: Home / Self Care | Payer: Self-pay | Attending: Emergency Medicine | Admitting: Emergency Medicine

## 2016-12-21 ENCOUNTER — Encounter (HOSPITAL_BASED_OUTPATIENT_CLINIC_OR_DEPARTMENT_OTHER): Payer: Self-pay

## 2016-12-21 DIAGNOSIS — Z5321 Procedure and treatment not carried out due to patient leaving prior to being seen by health care provider: Secondary | ICD-10-CM | POA: Insufficient documentation

## 2016-12-21 DIAGNOSIS — R109 Unspecified abdominal pain: Secondary | ICD-10-CM | POA: Insufficient documentation

## 2016-12-21 HISTORY — DX: Benign neoplasm of connective and other soft tissue, unspecified: D21.9

## 2016-12-21 NOTE — ED Triage Notes (Signed)
C/o LLQ pain x today-hx of same pain r/t to fibroids-NAD-steady gait

## 2016-12-22 ENCOUNTER — Emergency Department (HOSPITAL_BASED_OUTPATIENT_CLINIC_OR_DEPARTMENT_OTHER)
Admission: EM | Admit: 2016-12-22 | Discharge: 2016-12-22 | Disposition: A | Discharge status: Home / Self Care | Payer: Self-pay | Attending: Emergency Medicine | Admitting: Emergency Medicine

## 2016-12-22 ENCOUNTER — Encounter (HOSPITAL_BASED_OUTPATIENT_CLINIC_OR_DEPARTMENT_OTHER): Payer: Self-pay | Admitting: Emergency Medicine

## 2016-12-22 DIAGNOSIS — R1032 Left lower quadrant pain: Secondary | ICD-10-CM | POA: Insufficient documentation

## 2016-12-22 DIAGNOSIS — B9689 Other specified bacterial agents as the cause of diseases classified elsewhere: Secondary | ICD-10-CM

## 2016-12-22 DIAGNOSIS — N760 Acute vaginitis: Secondary | ICD-10-CM | POA: Insufficient documentation

## 2016-12-22 DIAGNOSIS — E039 Hypothyroidism, unspecified: Secondary | ICD-10-CM | POA: Insufficient documentation

## 2016-12-22 LAB — CBC WITH DIFFERENTIAL/PLATELET
BASOS ABS: 0 10*3/uL (ref 0.0–0.1)
Basophils Relative: 0 %
Eosinophils Absolute: 0.1 10*3/uL (ref 0.0–0.7)
Eosinophils Relative: 1 %
HCT: 37.1 % (ref 36.0–46.0)
HEMOGLOBIN: 12.1 g/dL (ref 12.0–15.0)
LYMPHS PCT: 22 %
Lymphs Abs: 2.3 10*3/uL (ref 0.7–4.0)
MCH: 26.7 pg (ref 26.0–34.0)
MCHC: 32.6 g/dL (ref 30.0–36.0)
MCV: 81.9 fL (ref 78.0–100.0)
Monocytes Absolute: 0.9 10*3/uL (ref 0.1–1.0)
Monocytes Relative: 9 %
NEUTROS ABS: 7.2 10*3/uL (ref 1.7–7.7)
Neutrophils Relative %: 68 %
PLATELETS: 200 10*3/uL (ref 150–400)
RBC: 4.53 MIL/uL (ref 3.87–5.11)
RDW: 14.9 % (ref 11.5–15.5)
WBC: 10.5 10*3/uL (ref 4.0–10.5)

## 2016-12-22 LAB — URINALYSIS, MICROSCOPIC (REFLEX)

## 2016-12-22 LAB — COMPREHENSIVE METABOLIC PANEL
ALBUMIN: 3.6 g/dL (ref 3.5–5.0)
ALT: 11 U/L — ABNORMAL LOW (ref 14–54)
ANION GAP: 6 (ref 5–15)
AST: 15 U/L (ref 15–41)
Alkaline Phosphatase: 98 U/L (ref 38–126)
BILIRUBIN TOTAL: 0.6 mg/dL (ref 0.3–1.2)
BUN: 8 mg/dL (ref 6–20)
CO2: 25 mmol/L (ref 22–32)
Calcium: 8.6 mg/dL — ABNORMAL LOW (ref 8.9–10.3)
Chloride: 104 mmol/L (ref 101–111)
Creatinine, Ser: 0.88 mg/dL (ref 0.44–1.00)
GFR calc Af Amer: >60 mL/min (ref >60)
GFR calc non Af Amer: >60 mL/min (ref >60)
GLUCOSE: 93 mg/dL (ref 65–99)
POTASSIUM: 4 mmol/L (ref 3.5–5.1)
Sodium: 135 mmol/L (ref 135–145)
TOTAL PROTEIN: 7.7 g/dL (ref 6.5–8.1)

## 2016-12-22 LAB — URINALYSIS, ROUTINE W REFLEX MICROSCOPIC
Glucose, UA: NEGATIVE mg/dL
KETONES UR: NEGATIVE mg/dL
NITRITE: POSITIVE — AB
PROTEIN: 30 mg/dL — AB
Specific Gravity, Urine: 1.02 (ref 1.005–1.030)
pH: 6.5 (ref 5.0–8.0)

## 2016-12-22 LAB — WET PREP, GENITAL
SPERM: NONE SEEN
TRICH WET PREP: NONE SEEN
YEAST WET PREP: NONE SEEN

## 2016-12-22 LAB — LIPASE, BLOOD: Lipase: 22 U/L (ref 11–51)

## 2016-12-22 MED ORDER — IBUPROFEN 800 MG PO TABS: 800.0000 mg | ORAL_TABLET | Freq: Three times a day (TID) | ORAL | 0 refills | Status: AC | PRN

## 2016-12-22 MED ORDER — KETOROLAC TROMETHAMINE 30 MG/ML IJ SOLN
30.0000 mg | Freq: Once | INTRAMUSCULAR | Status: AC
  Administered 2016-12-22: 30 mg via INTRAMUSCULAR
  Filled 2016-12-22: qty 1

## 2016-12-22 MED ORDER — METRONIDAZOLE 500 MG PO TABS: 500.0000 mg | ORAL_TABLET | Freq: Two times a day (BID) | ORAL | 0 refills | Status: DC

## 2016-12-22 MED FILL — metroNIDAZOLE 500 MG TABS: 500 | 7 days supply | Qty: 14 | Fill #0

## 2016-12-22 MED FILL — IBUPROFEN 800 MG TABS: 800 | 7 days supply | Qty: 21 | Fill #0

## 2016-12-22 NOTE — ED Notes (Signed)
Pt requesting to leave, pt reports her pain has decreased and she will follow up with primary care. Pt informed she was next to be seen by provider. Pt refused to stay.

## 2016-12-22 NOTE — ED Provider Notes (Signed)
Emergency Department Provider Note   I have reviewed the triage vital signs and the nursing notes.   HISTORY  Chief Complaint Abdominal Pain   HPI Destiny Hudson is a 36 y.o. female with PMH of fibroids and intermittent left lower quadrant abdominal pain presents emergency department for evaluation of similar pain. She denies any fevers or chills. No dysuria, hasn't seen, urgency. In symptoms typically coincide with her menstrual periods. She follows with OB/Gyn and they are considering hysterectomy. The pain she is experiencing today is not new or different for her. She describes constant pain with occasional worsening. Yesterday her pain was more severe and she presented to the emergency department but left prior to being seen. No associated nausea, vomiting, diarrhea. She denies any chest pain or difficult to breathing. She tried hydrocodone last night which improved her symptoms slightly.    Past Medical History:  Diagnosis Date  . Bronchitis   . Endometritis   . Fibroids   . Obesity   . Thyroid disease    Hypo    There are no active problems to display for this patient.   Past Surgical History:  Procedure Laterality Date  . ABLATION     uterus  . DILATION AND CURETTAGE OF UTERUS    . EYE SURGERY      Current Outpatient Rx  . Order #: [id number] Class: Print  . Order #: [id number] Class: Print  . Order #: [id number] Class: Print    Allergies Patient has no known allergies.  No family history on file.  Social History Social History  Substance Use Topics  . Smoking status: Never Smoker  . Smokeless tobacco: Never Used  . Alcohol use No    Review of Systems  Constitutional: No fever/chills Eyes: No visual changes. ENT: No sore throat. Cardiovascular: Denies chest pain. Respiratory: Denies shortness of breath. Gastrointestinal: Positive abdominal pain.  No nausea, no vomiting.  No diarrhea.  No constipation. Genitourinary: Negative for dysuria. Positive  vaginal bleeding with start on menstrual cycle (not more than normal).  Musculoskeletal: Negative for back pain. Skin: Negative for rash. Neurological: Negative for headaches, focal weakness or numbness.  10-point ROS otherwise negative.  ____________________________________________   PHYSICAL EXAM:  VITAL SIGNS: ED Triage Vitals  Enc Vitals Group     BP 12/22/16 1041 (!) 144/90     Pulse Rate 12/22/16 1041 73     Resp 12/22/16 1041 16     Temp 12/22/16 1041 98.2 F (36.8 C)     Temp src --      SpO2 12/22/16 1041 98 %     Weight 12/22/16 1042 296 lb (134.3 kg)     Height 12/22/16 1042 5\' 10"  (1.778 m)     Pain Score 12/22/16 1042 10    Constitutional: Alert and oriented. Well appearing and in no acute distress. Eyes: Conjunctivae are normal. Head: Atraumatic. Nose: No congestion/rhinnorhea. Mouth/Throat: Mucous membranes are moist.  Oropharynx non-erythematous. Neck: No stridor. Cardiovascular: Normal rate, regular rhythm. Good peripheral circulation. Grossly normal heart sounds.   Respiratory: Normal respiratory effort.  No retractions. Lungs CTAB. Gastrointestinal: Soft and nontender. No distention. Pelvic exam with no external lesions. Scant dark blood in the vagina. Normal cervix. No CMT or adnexal tenderness/fullness on exam.  Musculoskeletal: No lower extremity tenderness nor edema. No gross deformities of extremities. Neurologic:  Normal speech and language. No gross focal neurologic deficits are appreciated.  Skin:  Skin is warm, dry and intact. No rash noted.   ____________________________________________  LABS (all labs ordered are listed, but only abnormal results are displayed)  Labs Reviewed  WET PREP, GENITAL - Abnormal; Notable for the following:       Result Value   Clue Cells Wet Prep HPF POC PRESENT (*)    WBC, Wet Prep HPF POC MODERATE (*)    All other components within normal limits  COMPREHENSIVE METABOLIC PANEL - Abnormal; Notable for the  following:    Calcium 8.6 (*)    ALT 11 (*)    All other components within normal limits  URINALYSIS, ROUTINE W REFLEX MICROSCOPIC - Abnormal; Notable for the following:    Color, Urine AMBER (*)    APPearance CLOUDY (*)    Hgb urine dipstick LARGE (*)    Bilirubin Urine SMALL (*)    Protein, ur 30 (*)    Nitrite POSITIVE (*)    Leukocytes, UA TRACE (*)    All other components within normal limits  URINALYSIS, MICROSCOPIC (REFLEX) - Abnormal; Notable for the following:    Bacteria, UA FEW (*)    Squamous Epithelial / LPF 0-5 (*)    All other components within normal limits  URINE CULTURE  CBC WITH DIFFERENTIAL/PLATELET  LIPASE, BLOOD  GC/CHLAMYDIA PROBE AMP ([place]) NOT AT [facility]   ____________________________________________  RADIOLOGY  None ____________________________________________   PROCEDURES  Procedure(s) performed:   Procedures  None ____________________________________________   INITIAL IMPRESSION / ASSESSMENT AND PLAN / ED COURSE  Pertinent labs & imaging results that were available during my care of the patient were reviewed by me and considered in my medical decision making (see chart for details).  Patient resents to the emergency department for evaluation of chronic left lower quadrant abdominal pain. She is nontender to palpation. Plan for Toradol, labs, pelvic exam, reassess.   Differential diagnosis includes but is not exclusive to ectopic pregnancy, ovarian cyst, ovarian torsion, acute appendicitis, urinary tract infection, endometriosis, bowel obstruction, hernia, colitis, renal colic, gastroenteritis, volvulus etc.  Pelvic exam is largely unremarkable. This is a familiar pain for the patient. Feeling somewhat better after Toradol. Plan to treat BV and have her f/u with OB/Gyn for intermittent pelvic pain. Discharged with Motrin 800 mg tabs. Will stop the Goody's Powder.   At this time, I do not feel there is any life-threatening condition  present. I have reviewed and discussed all results (EKG, imaging, lab, urine as appropriate), exam findings with patient. I have reviewed nursing notes and appropriate previous records.  I feel the patient is safe to be discharged home without further emergent workup. Discussed usual and customary return precautions. Patient and family (if present) verbalize understanding and are comfortable with this plan.  Patient will follow-up with their primary care provider. If they do not have a primary care provider, information for follow-up has been provided to them. All questions have been answered.   ____________________________________________  FINAL CLINICAL IMPRESSION(S) / ED DIAGNOSES  Final diagnoses:  Left lower quadrant pain  Bacterial vaginosis     MEDICATIONS GIVEN DURING THIS VISIT:  Medications  ketorolac (TORADOL) 30 MG/ML injection 30 mg (30 mg Intramuscular Given 12/22/16 1129)     NEW OUTPATIENT MEDICATIONS STARTED DURING THIS VISIT:  Discharge Medication List as of 12/22/2016  1:31 PM    START taking these medications   Details  ibuprofen (ADVIL,MOTRIN) 800 MG tablet Take 1 tablet (800 mg total) by mouth every 8 (eight) hours as needed., Starting Wed 12/22/2016, Print    metroNIDAZOLE (FLAGYL) 500 MG tablet Take 1 tablet (  500 mg total) by mouth 2 (two) times daily., Starting Wed 12/22/2016, Print        Note:  This document was prepared using Dragon voice recognition software and may include unintentional dictation errors.  Nanda Quinton, MD Emergency Medicine    Long, [facility], MD 12/22/16 [phone]

## 2016-12-22 NOTE — Discharge Instructions (Signed)

## 2016-12-22 NOTE — ED Triage Notes (Signed)
LLQ abdominal pain since yesterday.  Pt states she has had this before due to fibroids.

## 2016-12-23 LAB — GC/CHLAMYDIA PROBE AMP (~~LOC~~) NOT AT ARMC
CHLAMYDIA, DNA PROBE: NEGATIVE
Neisseria Gonorrhea: NEGATIVE

## 2016-12-24 LAB — URINE CULTURE: SPECIAL REQUESTS: NORMAL

## 2017-06-01 ENCOUNTER — Emergency Department (HOSPITAL_BASED_OUTPATIENT_CLINIC_OR_DEPARTMENT_OTHER)
Admission: EM | Admit: 2017-06-01 | Discharge: 2017-06-01 | Disposition: A | Discharge status: Home / Self Care | Payer: Self-pay | Attending: Emergency Medicine | Admitting: Emergency Medicine

## 2017-06-01 ENCOUNTER — Emergency Department (HOSPITAL_COMMUNITY): Payer: Self-pay

## 2017-06-01 ENCOUNTER — Encounter (HOSPITAL_BASED_OUTPATIENT_CLINIC_OR_DEPARTMENT_OTHER): Payer: Self-pay | Admitting: *Deleted

## 2017-06-01 ENCOUNTER — Emergency Department (HOSPITAL_BASED_OUTPATIENT_CLINIC_OR_DEPARTMENT_OTHER): Payer: Self-pay

## 2017-06-01 ENCOUNTER — Other Ambulatory Visit: Payer: Self-pay

## 2017-06-01 DIAGNOSIS — N760 Acute vaginitis: Secondary | ICD-10-CM | POA: Insufficient documentation

## 2017-06-01 DIAGNOSIS — N938 Other specified abnormal uterine and vaginal bleeding: Secondary | ICD-10-CM | POA: Insufficient documentation

## 2017-06-01 DIAGNOSIS — R102 Pelvic and perineal pain: Secondary | ICD-10-CM

## 2017-06-01 DIAGNOSIS — B9689 Other specified bacterial agents as the cause of diseases classified elsewhere: Secondary | ICD-10-CM | POA: Insufficient documentation

## 2017-06-01 DIAGNOSIS — R1031 Right lower quadrant pain: Secondary | ICD-10-CM | POA: Insufficient documentation

## 2017-06-01 DIAGNOSIS — E039 Hypothyroidism, unspecified: Secondary | ICD-10-CM | POA: Insufficient documentation

## 2017-06-01 LAB — BASIC METABOLIC PANEL
Anion gap: 8 (ref 5–15)
BUN: 10 mg/dL (ref 6–20)
CALCIUM: 8.6 mg/dL — AB (ref 8.9–10.3)
CHLORIDE: 102 mmol/L (ref 101–111)
CO2: 25 mmol/L (ref 22–32)
Creatinine, Ser: 0.93 mg/dL (ref 0.44–1.00)
GFR calc non Af Amer: >60 mL/min (ref >60)
Glucose, Bld: 110 mg/dL — ABNORMAL HIGH (ref 65–99)
Potassium: 4.3 mmol/L (ref 3.5–5.1)
Sodium: 135 mmol/L (ref 135–145)

## 2017-06-01 LAB — URINALYSIS, ROUTINE W REFLEX MICROSCOPIC

## 2017-06-01 LAB — WET PREP, GENITAL
SPERM: NONE SEEN
TRICH WET PREP: NONE SEEN
YEAST WET PREP: NONE SEEN

## 2017-06-01 LAB — CBC WITH DIFFERENTIAL/PLATELET
Basophils Absolute: 0 10*3/uL (ref 0.0–0.1)
Basophils Relative: 0 %
EOS ABS: 0.1 10*3/uL (ref 0.0–0.7)
EOS PCT: 1 %
HCT: 36.9 % (ref 36.0–46.0)
Hemoglobin: 12.1 g/dL (ref 12.0–15.0)
Lymphocytes Relative: 17 %
Lymphs Abs: 1.9 10*3/uL (ref 0.7–4.0)
MCH: 27.1 pg (ref 26.0–34.0)
MCHC: 32.8 g/dL (ref 30.0–36.0)
MCV: 82.7 fL (ref 78.0–100.0)
MONO ABS: 0.5 10*3/uL (ref 0.1–1.0)
MONOS PCT: 4 %
Neutro Abs: 8.8 10*3/uL — ABNORMAL HIGH (ref 1.7–7.7)
Neutrophils Relative %: 78 %
Platelets: 226 10*3/uL (ref 150–400)
RBC: 4.46 MIL/uL (ref 3.87–5.11)
RDW: 14.2 % (ref 11.5–15.5)
WBC: 11.3 10*3/uL — ABNORMAL HIGH (ref 4.0–10.5)

## 2017-06-01 LAB — URINALYSIS, MICROSCOPIC (REFLEX)

## 2017-06-01 LAB — PREGNANCY, URINE: PREG TEST UR: NEGATIVE

## 2017-06-01 MED ORDER — ONDANSETRON HCL 4 MG PO TABS: 4.0000 mg | ORAL_TABLET | Freq: Four times a day (QID) | ORAL | 0 refills | Status: AC

## 2017-06-01 MED ORDER — KETOROLAC TROMETHAMINE 30 MG/ML IJ SOLN
30.0000 mg | Freq: Once | INTRAMUSCULAR | Status: AC
  Administered 2017-06-01: 30 mg via INTRAVENOUS
  Filled 2017-06-01: qty 1

## 2017-06-01 MED ORDER — NAPROXEN 375 MG PO TABS: 375.0000 mg | ORAL_TABLET | Freq: Two times a day (BID) | ORAL | 0 refills | Status: AC

## 2017-06-01 MED ORDER — ONDANSETRON 4 MG PO TBDP
4.0000 mg | ORAL_TABLET | Freq: Once | ORAL | Status: DC
  Filled 2017-06-01: qty 1

## 2017-06-01 MED ORDER — KETOROLAC TROMETHAMINE 60 MG/2ML IM SOLN
60.0000 mg | Freq: Once | INTRAMUSCULAR | Status: DC
  Filled 2017-06-01: qty 2

## 2017-06-01 MED ORDER — ONDANSETRON HCL 4 MG/2ML IJ SOLN
4.0000 mg | Freq: Once | INTRAMUSCULAR | Status: AC
  Administered 2017-06-01: 4 mg via INTRAVENOUS
  Filled 2017-06-01: qty 2

## 2017-06-01 MED ORDER — METRONIDAZOLE 500 MG PO TABS: 500.0000 mg | ORAL_TABLET | Freq: Two times a day (BID) | ORAL | 0 refills | Status: DC

## 2017-06-01 NOTE — ED Triage Notes (Addendum)
Pt c/o right flank which radiates to right lower abd x 1 day also c/o heavy vaginal bleeding

## 2017-06-01 NOTE — ED Notes (Signed)
ED Provider at bedside. 

## 2017-06-01 NOTE — Discharge Instructions (Signed)
You can take Tylenol or Ibuprofen as directed for pain. You can alternate Tylenol and Ibuprofen every 4 hours. If you take Tylenol at 1pm, then you can take Ibuprofen at 5pm. Then you can take Tylenol again at 9pm.   You can also take naproxen.  Take Zofran as directed for nausea.  Take Flagyl as directed.  It is very important that you do not consume any alcohol while taking this medication as it will cause you to become violently ill.   As we discussed, you need to follow-up with your OB/GYN regarding your ultrasound and your symptoms.  Return the emergency department for any fever, worsening pain, worsening vaginal bleeding, vomiting or any other worsening or concerning symptoms.

## 2017-06-01 NOTE — ED Notes (Addendum)
Scant amount of urine obtained-lab rejected sample

## 2017-06-01 NOTE — ED Provider Notes (Signed)
[facility] Provider Note   CSN: [id number] Arrival date & time: 06/01/17  1753     History   Chief Complaint Chief Complaint  Patient presents with  . Flank Pain  . Vaginal Bleeding    HPI Destiny Hudson is a 37 y.o. female who presents for evaluation of intermittent vaginal bleeding that has been ongoing for the last 2-1/2 weeks and right flank pain that began today.  Patient reports that she has a history of endometriosis and uterine fibroids.  She reports that she had had a uterine ablation approximately a year ago and has had some improvement in symptoms up into the last 6 months.  Patient reports that she has had intermittent heavy periods over the last 6 months.  Most recently, she reports that she started having vaginal bleeding on 05/19/17.  Since then she has had persistent bleeding over the last 2 and half weeks.  She reports using 1-2 pads a day.  She reports that over the last 24-48 hours, she noticed heavier flow, present of clots in the blood.  Additionally, patient was at work today when she started experiencing some right sided flank pain that began radiating around to the right side.  Patient reports that she is also experiencing some nausea.  Patient denies any fevers, vomiting, chest pain, difficulty breathing.  Patient is not currently sexually active.  She denies any vaginal discharge.  The history is provided by the patient.    Past Medical History:  Diagnosis Date  . Bronchitis   . Endometritis   . Fibroids   . Obesity   . Thyroid disease    Hypo    There are no active problems to display for this patient.   Past Surgical History:  Procedure Laterality Date  . ABLATION     uterus  . DILATION AND CURETTAGE OF UTERUS    . EYE SURGERY      OB History    No data available       Home Medications    Prior to Admission medications   Medication Sig Start Date End Date Taking? Authorizing Provider  ibuprofen  (ADVIL,MOTRIN) 800 MG tablet Take 1 tablet (800 mg total) by mouth every 8 (eight) hours as needed. 12/22/16   Long, Wonda Olds, MD  metroNIDAZOLE (FLAGYL) 500 MG tablet Take 1 tablet (500 mg total) by mouth 2 (two) times daily. 06/01/17   Volanda Napoleon, PA-C  naproxen (NAPROSYN) 375 MG tablet Take 1 tablet (375 mg total) by mouth 2 (two) times daily. 06/01/17   Volanda Napoleon, PA-C  ondansetron (ZOFRAN) 4 MG tablet Take 1 tablet (4 mg total) by mouth every 6 (six) hours. 06/01/17   Volanda Napoleon, PA-C    Family History History reviewed. No pertinent family history.  Social History Social History   Tobacco Use  . Smoking status: Never Smoker  . Smokeless tobacco: Never Used  Substance Use Topics  . Alcohol use: No  . Drug use: No     Allergies   Patient has no known allergies.   Review of Systems Review of Systems  Constitutional: Negative for chills and fever.  HENT: Negative for congestion.   Eyes: Negative for visual disturbance.  Respiratory: Negative for cough and shortness of breath.   Cardiovascular: Negative for chest pain.  Gastrointestinal: Negative for abdominal pain, diarrhea, nausea and vomiting.  Genitourinary: Positive for flank pain and vaginal bleeding. Negative for dysuria, hematuria and vaginal discharge.  Musculoskeletal: Negative  for back pain and neck pain.  Skin: Negative for rash.  Neurological: Negative for dizziness, weakness, numbness and headaches.  Psychiatric/Behavioral: Negative for confusion.     Physical Exam Updated Vital Signs BP 118/68 (BP Location: Left Arm)   Pulse 79   Temp 98.2 F (36.8 C) (Oral)   Resp 16   Ht 5\' 9"  (1.753 m)   Wt 136.1 kg (300 lb)   SpO2 95%   BMI 44.30 kg/m   Physical Exam  Constitutional: She is oriented to person, place, and time. She appears well-developed and well-nourished.  HENT:  Head: Normocephalic and atraumatic.  Mouth/Throat: Oropharynx is clear and moist and mucous membranes are  normal.  Eyes: Conjunctivae, EOM and lids are normal. Pupils are equal, round, and reactive to light.  Neck: Full passive range of motion without pain.  Cardiovascular: Normal rate, regular rhythm, normal heart sounds and normal pulses. Exam reveals no gallop and no friction rub.  No murmur heard. Pulmonary/Chest: Effort normal and breath sounds normal.  Abdominal: Soft. Normal appearance. There is no tenderness. There is no rigidity, no guarding and no CVA tenderness.  Diffuse lower right side pain.  No true McBurney's point tenderness.  Abdomen is soft, nondistended, nontender.  No CVA tenderness bilaterally.  Genitourinary: Uterus normal. Uterus is not tender. Cervix exhibits no motion tenderness and no friability. Right adnexum displays no mass and no tenderness. Left adnexum displays no mass and no tenderness. There is bleeding in the vagina.  Genitourinary Comments: The exam was performed with a chaperone present.  Vaginal bleeding noted but no hemorrhaging.  No presence of clots.  Cervix was able to be visualized no evidence of friability.  No CMT noted on exam.  No adnexal mass or tenderness bilaterally.   Musculoskeletal: Normal range of motion.  Neurological: She is alert and oriented to person, place, and time.  Skin: Skin is warm and dry. Capillary refill takes less than 2 seconds.  Psychiatric: She has a normal mood and affect. Her speech is normal.  Nursing note and vitals reviewed.    ED Treatments / Results  Labs (all labs ordered are listed, but only abnormal results are displayed) Labs Reviewed  WET PREP, GENITAL - Abnormal; Notable for the following components:      Result Value   Clue Cells Wet Prep HPF POC PRESENT (*)    WBC, Wet Prep HPF POC MODERATE (*)    All other components within normal limits  URINALYSIS, ROUTINE W REFLEX MICROSCOPIC - Abnormal; Notable for the following components:   Color, Urine RED (*)    APPearance TURBID (*)    Glucose, UA   (*)     Value: TEST NOT REPORTED DUE TO COLOR INTERFERENCE OF URINE PIGMENT   Hgb urine dipstick   (*)    Value: TEST NOT REPORTED DUE TO COLOR INTERFERENCE OF URINE PIGMENT   Bilirubin Urine   (*)    Value: TEST NOT REPORTED DUE TO COLOR INTERFERENCE OF URINE PIGMENT   Ketones, ur   (*)    Value: TEST NOT REPORTED DUE TO COLOR INTERFERENCE OF URINE PIGMENT   Protein, ur   (*)    Value: TEST NOT REPORTED DUE TO COLOR INTERFERENCE OF URINE PIGMENT   Nitrite   (*)    Value: TEST NOT REPORTED DUE TO COLOR INTERFERENCE OF URINE PIGMENT   Leukocytes, UA   (*)    Value: TEST NOT REPORTED DUE TO COLOR INTERFERENCE OF URINE PIGMENT   All other  components within normal limits  URINALYSIS, MICROSCOPIC (REFLEX) - Abnormal; Notable for the following components:   Bacteria, UA FEW (*)    Squamous Epithelial / LPF 0-5 (*)    All other components within normal limits  CBC WITH DIFFERENTIAL/PLATELET - Abnormal; Notable for the following components:   WBC 11.3 (*)    Neutro Abs 8.8 (*)    All other components within normal limits  BASIC METABOLIC PANEL - Abnormal; Notable for the following components:   Glucose, Bld 110 (*)    Calcium 8.6 (*)    All other components within normal limits  PREGNANCY, URINE  GC/CHLAMYDIA PROBE AMP ([place]) NOT AT [facility]    EKG  EKG Interpretation None       Radiology US Pelvic Complete W Transvaginal And Torsion R/o  Result Date: 06/01/2017 CLINICAL DATA:  Right adnexal pain. History of fibroids and endometrial ablation EXAM: TRANSABDOMINAL AND TRANSVAGINAL ULTRASOUND OF PELVIS TECHNIQUE: Both transabdominal and transvaginal ultrasound examinations of the pelvis were performed. Transabdominal technique was performed for global imaging of the pelvis including uterus, ovaries, adnexal regions, and pelvic cul-de-sac. It was necessary to proceed with endovaginal exam following the transabdominal exam to visualize the endometrium. COMPARISON:  07/10/2016 FINDINGS: Uterus  Measurements: 10.3 x 5.9 x 8.1 cm. Heterogeneous echotexture without measurable focal fibroid. Endometrium Thickness: 9 mm in thickness. 9 mm cystic area noted in the lower uterine segment region. Right ovary Measurements: 4.6 x 3.0 x 2.8 cm. Normal appearance/no adnexal mass. Left ovary Measurements: Not visualized.  No adnexal mass seen. Other findings No abnormal free fluid. IMPRESSION: Heterogeneous echotexture throughout the uterus without measurable focal fibroid. No acute findings. Electronically Signed   By: Rolm Baptise M.D.   On: 06/01/2017 23:18    Procedures Procedures (including critical care time)  Medications Ordered in ED Medications  ketorolac (TORADOL) 30 MG/ML injection 30 mg (30 mg Intravenous Given 06/01/17 2017)  ondansetron (ZOFRAN) injection 4 mg (4 mg Intravenous Given 06/01/17 2018)     Initial Impression / Assessment and Plan / ED Course  I have reviewed the triage vital signs and the nursing notes.  Pertinent labs & imaging results that were available during my care of the patient were reviewed by me and considered in my medical decision making (see chart for details).     37 year old female with PMH/o endometriosis, Uterine fibroids who presents for evaluation of intermittent vaginal bleeding is been ongoing for the last 2-1/2 weeks and right side/flank pain that began today.  Patient has a known history of endometriosis and had an ablation.  Patient reports that over the last 6 months, she has had worsening pain and intermittent periods.  She has been evaluated by her OB/GYN who noted the presence of fibroids and her last ultrasound.  No fevers, abdominal pain, diarrhea. Patient is afebrile, non-toxic appearing, sitting comfortably on examination table. Vital signs reviewed and stable. Consider dysfunctional uterine bleeding associated with fibroids.  Also consider pregnancy.  History/physical exam is not concerning for kidney stone, appendicitis, diverticulitis.  Plan  to check basic labs, pelvic exam, ultrasound.  Pelvic exam as documented above.  No CMT that would be concerning for PID.  There was some bleeding in the vaginal vault.  No friability of the cervix.  No hemorrhage noted.   CBC shows slight leukocytosis of 11.3. Hgb/Hct are stable. No anemia. BMP is unremarkable.  Urine pregnancy is negative.  UA is unable to be fully evaluated secondary to bleeding.  Wet prep shows clue  cells.  Ultrasound shows that heterogenous echotexture throughout the uterus with a small cyst.  No other acute abnormalities.  I discussed results with patient.  She reports improvement in pain after analgesics.  She has been able to tolerate p.o. in the department without any difficulty.  Vital signs are stable.  Repeat repeat abdominal exam shows no tenderness to the right lower quadrant.  No CVA tenderness.  Exam is not concerning for appendicitis, diverticulitis, perforation.  Suspect the symptoms are likely secondary to dysfunctional uterine bleeding.  Instructed patient to follow-up with her OB/GYN regarding ultrasound findings.  We will plan to treat for BV. Patient had ample opportunity for questions and discussion. All patient's questions were answered with full understanding. Strict return precautions discussed. Patient expresses understanding and agreement to plan.    Final Clinical Impressions(s) / ED Diagnoses   Final diagnoses:  Dysfunctional uterine bleeding  BV (bacterial vaginosis)    ED Discharge Orders        Ordered    naproxen (NAPROSYN) 375 MG tablet  2 times daily     06/01/17 2339    ondansetron (ZOFRAN) 4 MG tablet  Every 6 hours     06/01/17 2339    metroNIDAZOLE (FLAGYL) 500 MG tablet  2 times daily     06/01/17 2340       Desma Mcgregor 06/02/17 0052    Margette Fast, MD 06/02/17 1428

## 2017-06-02 LAB — GC/CHLAMYDIA PROBE AMP (~~LOC~~) NOT AT ARMC
CHLAMYDIA, DNA PROBE: NEGATIVE
Neisseria Gonorrhea: NEGATIVE

## 2017-07-10 ENCOUNTER — Encounter (HOSPITAL_BASED_OUTPATIENT_CLINIC_OR_DEPARTMENT_OTHER): Payer: Self-pay | Admitting: *Deleted

## 2017-07-10 ENCOUNTER — Other Ambulatory Visit: Payer: Self-pay

## 2017-07-10 ENCOUNTER — Emergency Department (HOSPITAL_BASED_OUTPATIENT_CLINIC_OR_DEPARTMENT_OTHER)
Admission: EM | Admit: 2017-07-10 | Discharge: 2017-07-11 | Disposition: A | Discharge status: Home / Self Care | Payer: Self-pay | Attending: Emergency Medicine | Admitting: Emergency Medicine

## 2017-07-10 DIAGNOSIS — R1110 Vomiting, unspecified: Secondary | ICD-10-CM | POA: Insufficient documentation

## 2017-07-10 DIAGNOSIS — R197 Diarrhea, unspecified: Secondary | ICD-10-CM | POA: Insufficient documentation

## 2017-07-10 DIAGNOSIS — R109 Unspecified abdominal pain: Secondary | ICD-10-CM | POA: Insufficient documentation

## 2017-07-10 DIAGNOSIS — B349 Viral infection, unspecified: Secondary | ICD-10-CM | POA: Insufficient documentation

## 2017-07-10 DIAGNOSIS — R6883 Chills (without fever): Secondary | ICD-10-CM | POA: Insufficient documentation

## 2017-07-10 MED ORDER — ONDANSETRON 4 MG PO TBDP
4.0000 mg | ORAL_TABLET | Freq: Once | ORAL | Status: AC
  Administered 2017-07-10: 4 mg via ORAL
  Filled 2017-07-10: qty 1

## 2017-07-10 MED ORDER — ACETAMINOPHEN 325 MG PO TABS
650.0000 mg | ORAL_TABLET | Freq: Once | ORAL | Status: AC | PRN
  Administered 2017-07-10: 650 mg via ORAL
  Filled 2017-07-10: qty 2

## 2017-07-10 MED ORDER — KETOROLAC TROMETHAMINE 30 MG/ML IJ SOLN
30.0000 mg | Freq: Once | INTRAMUSCULAR | Status: AC
  Administered 2017-07-10: 30 mg via INTRAMUSCULAR
  Filled 2017-07-10: qty 1

## 2017-07-10 NOTE — ED Notes (Signed)
Took sinus medicine at 6 am and Tylenol at 3:30 pm with no relief.  Head still hurts.  Can't stay warm since yesterday.  She felt hot/cold all day yesterday.

## 2017-07-10 NOTE — ED Triage Notes (Signed)
Headache yesterday. Chills today. Vomited x 2. Temp 100.3 in triage

## 2017-07-11 ENCOUNTER — Other Ambulatory Visit: Payer: Self-pay

## 2017-07-11 ENCOUNTER — Emergency Department (HOSPITAL_BASED_OUTPATIENT_CLINIC_OR_DEPARTMENT_OTHER): Payer: Self-pay

## 2017-07-11 ENCOUNTER — Encounter (HOSPITAL_BASED_OUTPATIENT_CLINIC_OR_DEPARTMENT_OTHER): Payer: Self-pay | Admitting: *Deleted

## 2017-07-11 ENCOUNTER — Emergency Department (HOSPITAL_BASED_OUTPATIENT_CLINIC_OR_DEPARTMENT_OTHER)
Admission: EM | Admit: 2017-07-11 | Discharge: 2017-07-12 | Disposition: A | Discharge status: Home / Self Care | Payer: Self-pay | Attending: Emergency Medicine | Admitting: Emergency Medicine

## 2017-07-11 DIAGNOSIS — Z79899 Other long term (current) drug therapy: Secondary | ICD-10-CM | POA: Insufficient documentation

## 2017-07-11 DIAGNOSIS — E039 Hypothyroidism, unspecified: Secondary | ICD-10-CM | POA: Insufficient documentation

## 2017-07-11 DIAGNOSIS — B349 Viral infection, unspecified: Secondary | ICD-10-CM | POA: Insufficient documentation

## 2017-07-11 DIAGNOSIS — R51 Headache: Secondary | ICD-10-CM | POA: Insufficient documentation

## 2017-07-11 DIAGNOSIS — R519 Headache, unspecified: Secondary | ICD-10-CM

## 2017-07-11 MED ORDER — ONDANSETRON 4 MG PO TBDP: 4.0000 mg | ORAL_TABLET | Freq: Three times a day (TID) | ORAL | 0 refills | Status: AC | PRN

## 2017-07-11 MED ORDER — ACETAMINOPHEN 325 MG PO TABS
650.0000 mg | ORAL_TABLET | Freq: Once | ORAL | Status: AC | PRN
  Administered 2017-07-11: 650 mg via ORAL
  Filled 2017-07-11: qty 2

## 2017-07-11 MED ORDER — PROCHLORPERAZINE EDISYLATE 5 MG/ML IJ SOLN
10.0000 mg | Freq: Once | INTRAMUSCULAR | Status: AC
  Administered 2017-07-12: 10 mg via INTRAVENOUS
  Filled 2017-07-11: qty 2

## 2017-07-11 MED ORDER — DIPHENHYDRAMINE HCL 50 MG/ML IJ SOLN
25.0000 mg | Freq: Once | INTRAMUSCULAR | Status: AC
  Administered 2017-07-12: 25 mg via INTRAVENOUS
  Filled 2017-07-11: qty 1

## 2017-07-11 MED ORDER — DEXAMETHASONE SODIUM PHOSPHATE 10 MG/ML IJ SOLN
10.0000 mg | Freq: Once | INTRAMUSCULAR | Status: AC
  Administered 2017-07-12: 10 mg via INTRAVENOUS
  Filled 2017-07-11: qty 1

## 2017-07-11 MED ORDER — SODIUM CHLORIDE 0.9 % IV BOLUS
1000.0000 mL | Freq: Once | INTRAVENOUS | Status: AC
  Administered 2017-07-12: 1000 mL via INTRAVENOUS

## 2017-07-11 MED ORDER — IBUPROFEN 600 MG PO TABS: 600.0000 mg | ORAL_TABLET | Freq: Four times a day (QID) | ORAL | 0 refills | Status: AC | PRN

## 2017-07-11 MED ORDER — KETOROLAC TROMETHAMINE 30 MG/ML IJ SOLN
30.0000 mg | Freq: Once | INTRAMUSCULAR | Status: AC
  Administered 2017-07-12: 30 mg via INTRAVENOUS
  Filled 2017-07-11: qty 1

## 2017-07-11 NOTE — ED Provider Notes (Signed)
[facility] Provider Note   CSN: [id number] Arrival date & time: 07/11/17  2045     History   Chief Complaint Chief Complaint  Patient presents with  . Fever  . Headache    HPI Destiny Hudson is a 37 y.o. female.  Patient returns with complaints of headache.  Patient was seen yesterday for low-grade fever with headache, chills, nausea and vomiting.  She reports that the treatment she received yesterday ease the headache off, but it came back.  She is no longer having the fever, chills.  She reports a diffuse frontal headache with light and sound sensitivity.  Pain also centers around her maxillary sinus area.  She reports that she does not normally have headaches.  She feels weak today.  She has not had any cough, chest congestion, cold symptoms.     Past Medical History:  Diagnosis Date  . Bronchitis   . Endometritis   . Fibroids   . Obesity   . Thyroid disease    Hypo    There are no active problems to display for this patient.   Past Surgical History:  Procedure Laterality Date  . ABLATION     uterus  . DILATION AND CURETTAGE OF UTERUS    . EYE SURGERY       OB History   None      Home Medications    Prior to Admission medications   Medication Sig Start Date End Date Taking? Authorizing Provider  ibuprofen (ADVIL,MOTRIN) 600 MG tablet Take 1 tablet (600 mg total) by mouth every 6 (six) hours as needed. 07/11/17   Horton, Barbette Hair, MD  ibuprofen (ADVIL,MOTRIN) 800 MG tablet Take 1 tablet (800 mg total) by mouth every 8 (eight) hours as needed. 12/22/16   Long, Wonda Olds, MD  metroNIDAZOLE (FLAGYL) 500 MG tablet Take 1 tablet (500 mg total) by mouth 2 (two) times daily. 06/01/17   Volanda Napoleon, PA-C  naproxen (NAPROSYN) 375 MG tablet Take 1 tablet (375 mg total) by mouth 2 (two) times daily. 06/01/17   Volanda Napoleon, PA-C  ondansetron (ZOFRAN ODT) 4 MG disintegrating tablet Take 1 tablet (4 mg total) by mouth every 8  (eight) hours as needed for nausea or vomiting. 07/11/17   Horton, Barbette Hair, MD  ondansetron (ZOFRAN) 4 MG tablet Take 1 tablet (4 mg total) by mouth every 6 (six) hours. 06/01/17   Volanda Napoleon, PA-C  traMADol (ULTRAM) 50 MG tablet Take 1 tablet (50 mg total) by mouth every 6 (six) hours as needed. 07/12/17   Orpah Greek, MD    Family History No family history on file.  Social History Social History   Tobacco Use  . Smoking status: Never Smoker  . Smokeless tobacco: Never Used  Substance Use Topics  . Alcohol use: No  . Drug use: No     Allergies   Patient has no known allergies.   Review of Systems Review of Systems  Constitutional: Positive for fatigue.  Neurological: Positive for headaches.  All other systems reviewed and are negative.    Physical Exam Updated Vital Signs BP 108/81 (BP Location: Right Arm)   Pulse (!) 58   Temp 98.5 F (36.9 C) (Oral)   Resp 18   Ht 5\' 10"  (1.778 m)   Wt (!) 138.8 kg (306 lb)   SpO2 93%   BMI 43.91 kg/m   Physical Exam  Constitutional: She is oriented to person, place, and time.  She appears well-developed and well-nourished. No distress.  HENT:  Head: Normocephalic and atraumatic.  Right Ear: Hearing normal.  Left Ear: Hearing normal.  Nose: Nose normal.  Mouth/Throat: Oropharynx is clear and moist and mucous membranes are normal.  Eyes: Pupils are equal, round, and reactive to light. Conjunctivae and EOM are normal.  Neck: Normal range of motion. Neck supple.  Cardiovascular: Regular rhythm, S1 normal and S2 normal. Exam reveals no gallop and no friction rub.  No murmur heard. Pulmonary/Chest: Effort normal and breath sounds normal. No respiratory distress. She exhibits no tenderness.  Abdominal: Soft. Normal appearance and bowel sounds are normal. There is no hepatosplenomegaly. There is no tenderness. There is no rebound, no guarding, no tenderness at McBurney's point and negative Murphy's sign. No hernia.   Musculoskeletal: Normal range of motion.  Neurological: She is alert and oriented to person, place, and time. She has normal strength. No cranial nerve deficit or sensory deficit. Coordination normal. GCS eye subscore is 4. GCS verbal subscore is 5. GCS motor subscore is 6.  Skin: Skin is warm, dry and intact. No rash noted. No cyanosis.  Psychiatric: She has a normal mood and affect. Her speech is normal and behavior is normal. Thought content normal.  Nursing note and vitals reviewed.    ED Treatments / Results  Labs (all labs ordered are listed, but only abnormal results are displayed) Labs Reviewed  BASIC METABOLIC PANEL - Abnormal; Notable for the following components:      Result Value   Sodium 132 (*)    Chloride 100 (*)    Glucose, Bld 106 (*)    Calcium 8.4 (*)    All other components within normal limits  CBC WITH DIFFERENTIAL/PLATELET    EKG None  Radiology Ct Head Wo Contrast  Result Date: 07/12/2017 CLINICAL DATA:  Acute headache. EXAM: CT HEAD WITHOUT CONTRAST TECHNIQUE: Contiguous axial images were obtained from the base of the skull through the vertex without intravenous contrast. COMPARISON:  None. FINDINGS: Brain: No intracranial hemorrhage, mass effect, or midline shift. No hydrocephalus. The basilar cisterns are patent. No evidence of territorial infarct or acute ischemia. No extra-axial or intracranial fluid collection. Vascular: No hyperdense vessel or unexpected calcification. Skull: No fracture or focal lesion. Sinuses/Orbits: Paranasal sinuses and mastoid air cells are clear. The visualized orbits are unremarkable. Other: None. IMPRESSION: Unremarkable noncontrast head CT. Electronically Signed   By: Jeb Levering M.D.   On: 07/12/2017 00:27    Procedures Procedures (including critical care time)  Medications Ordered in ED Medications  acetaminophen (TYLENOL) tablet 650 mg (650 mg Oral Given 07/11/17 2122)  sodium chloride 0.9 % bolus 1,000 mL (0 mLs  Intravenous Stopped 07/12/17 0121)  dexamethasone (DECADRON) injection 10 mg (10 mg Intravenous Given 07/12/17 0026)  ketorolac (TORADOL) 30 MG/ML injection 30 mg (30 mg Intravenous Given 07/12/17 0025)  prochlorperazine (COMPAZINE) injection 10 mg (10 mg Intravenous Given 07/12/17 0028)  diphenhydrAMINE (BENADRYL) injection 25 mg (25 mg Intravenous Given 07/12/17 0032)     Initial Impression / Assessment and Plan / ED Course  I have reviewed the triage vital signs and the nursing notes.  Pertinent labs & imaging results that were available during my care of the patient were reviewed by me and considered in my medical decision making (see chart for details).     Patient returns with persistent headache.  She was seen yesterday for same.  She had a low-grade fever of 100.3 yesterday, but upon arrival to the ER today  she is afebrile.  She has no evidence of meningismus on examination.  She is awake, alert and oriented.  I do not have any concern for meningitis, encephalitis, CNS infection at this time.  She likely is experiencing a viral illness with associated headache.  She did, however, have some light and sound sensitivity, treated with Decadron, Toradol, Compazine, Benadryl as possible migraine headache.  She has had resolution of her headache and feels much improvement.  Vital signs are normal, still afebrile.  Patient will be discharged with symptomatic treatment, follow-up with PCP.  Final Clinical Impressions(s) / ED Diagnoses   Final diagnoses:  Bad headache  Viral illness    ED Discharge Orders        Ordered    traMADol (ULTRAM) 50 MG tablet  Every 6 hours PRN     07/12/17 0134       Orpah Greek, MD 07/12/17 0134

## 2017-07-11 NOTE — ED Notes (Signed)
Pt. Reports headache today that causes her nausea.  Pt. Reports she has sensitivity to light and noise.  No visual disturbance.  Pt. Drove self to ED

## 2017-07-11 NOTE — ED Provider Notes (Signed)
[place] EMERGENCY DEPARTMENT Provider Note   CSN: [id number] Arrival date & time: 07/10/17  2129     History   Chief Complaint Chief Complaint  Patient presents with  . Headache  . Chills    HPI Destiny Hudson is a 37 y.o. female.  HPI  This is a 37 year old female who presents with chills, headache, vomiting.  Patient reports onset of symptoms yesterday.  She reports chills without documented fevers.  Temperature here 100.3.  She states she has had a frontal headache and "felt like I had allergies."  Patient took a nasal decongestant and nasal spray with some relief.  She is also take ibuprofen for her headache which seems to help some.  However she developed nonbilious, nonbloody emesis.  She does report 3 bowel movements that were "normal."  No diarrhea.  Reports viral illness at work over the last 1-2 weeks.  Past Medical History:  Diagnosis Date  . Bronchitis   . Endometritis   . Fibroids   . Obesity   . Thyroid disease    Hypo    There are no active problems to display for this patient.   Past Surgical History:  Procedure Laterality Date  . ABLATION     uterus  . DILATION AND CURETTAGE OF UTERUS    . EYE SURGERY       OB History   None      Home Medications    Prior to Admission medications   Medication Sig Start Date End Date Taking? Authorizing Provider  ibuprofen (ADVIL,MOTRIN) 600 MG tablet Take 1 tablet (600 mg total) by mouth every 6 (six) hours as needed. 07/11/17   Elowyn Raupp, Barbette Hair, MD  ibuprofen (ADVIL,MOTRIN) 800 MG tablet Take 1 tablet (800 mg total) by mouth every 8 (eight) hours as needed. 12/22/16   Long, Wonda Olds, MD  metroNIDAZOLE (FLAGYL) 500 MG tablet Take 1 tablet (500 mg total) by mouth 2 (two) times daily. 06/01/17   Volanda Napoleon, PA-C  naproxen (NAPROSYN) 375 MG tablet Take 1 tablet (375 mg total) by mouth 2 (two) times daily. 06/01/17   Volanda Napoleon, PA-C  ondansetron (ZOFRAN ODT) 4 MG disintegrating tablet  Take 1 tablet (4 mg total) by mouth every 8 (eight) hours as needed for nausea or vomiting. 07/11/17   Jarrette Dehner, Barbette Hair, MD  ondansetron (ZOFRAN) 4 MG tablet Take 1 tablet (4 mg total) by mouth every 6 (six) hours. 06/01/17   Volanda Napoleon, PA-C    Family History No family history on file.  Social History Social History   Tobacco Use  . Smoking status: Never Smoker  . Smokeless tobacco: Never Used  Substance Use Topics  . Alcohol use: No  . Drug use: No     Allergies   Patient has no known allergies.   Review of Systems Review of Systems  Constitutional: Positive for chills and fever.  HENT: Positive for congestion. Negative for trouble swallowing.   Respiratory: Negative for cough and shortness of breath.   Cardiovascular: Negative for chest pain.  Gastrointestinal: Positive for nausea and vomiting. Negative for abdominal pain and diarrhea.  Genitourinary: Negative for dysuria.  Neurological: Positive for headaches. Negative for weakness.     Physical Exam Updated Vital Signs BP 118/80 (BP Location: Left Arm)   Pulse 94   Temp 100.3 F (37.9 C) (Oral)   Resp 18   Ht 5\' 10"  (1.778 m)   Wt (!) 138.8 kg (306 lb)  SpO2 100%   BMI 43.91 kg/m   Physical Exam  Constitutional: She is oriented to person, place, and time. She appears well-developed and well-nourished.  Obese, no acute distress  HENT:  Head: Normocephalic and atraumatic.  Eyes: Pupils are equal, round, and reactive to light. EOM are normal.  Neck: Normal range of motion.  No meningismus  Cardiovascular: Normal rate, regular rhythm and normal heart sounds.  Pulmonary/Chest: Effort normal. No respiratory distress. She has no wheezes.  Abdominal: Soft. Bowel sounds are normal.  Neurological: She is alert and oriented to person, place, and time.  Skin: Skin is warm and dry.  Psychiatric: She has a normal mood and affect.  Nursing note and vitals reviewed.    ED Treatments / Results  Labs (all  labs ordered are listed, but only abnormal results are displayed) Labs Reviewed - No data to display  EKG None  Radiology No results found.  Procedures Procedures (including critical care time)  Medications Ordered in ED Medications  acetaminophen (TYLENOL) tablet 650 mg (650 mg Oral Given 07/10/17 2200)  ketorolac (TORADOL) 30 MG/ML injection 30 mg (30 mg Intramuscular Given 07/10/17 2330)  ondansetron (ZOFRAN-ODT) disintegrating tablet 4 mg (4 mg Oral Given 07/10/17 2330)     Initial Impression / Assessment and Plan / ED Course  I have reviewed the triage vital signs and the nursing notes.  Pertinent labs & imaging results that were available during my care of the patient were reviewed by me and considered in my medical decision making (see chart for details).     Patient presents with chills, headache, vomiting.  Temperature of 100.3 otherwise nontoxic-appearing.  Physical exam is fairly benign.  No meningismus to suggest meningitis.  Abdominal exam is benign.  Suspect viral etiology.  Patient given Toradol and Zofran.  She is able to orally hydrate and on recheck states she feels much better.  Will treat symptomatically.  After history, exam, and medical workup I feel the patient has been appropriately medically screened and is safe for discharge home. Pertinent diagnoses were discussed with the patient. Patient was given return precautions.   Final Clinical Impressions(s) / ED Diagnoses   Final diagnoses:  Viral syndrome  Abdominal pain, vomiting, and diarrhea    ED Discharge Orders        Ordered    ondansetron (ZOFRAN ODT) 4 MG disintegrating tablet  Every 8 hours PRN     07/11/17 0033    ibuprofen (ADVIL,MOTRIN) 600 MG tablet  Every 6 hours PRN     07/11/17 0033       Merryl Hacker, MD 07/11/17 [phone]

## 2017-07-11 NOTE — ED Triage Notes (Signed)
Headache fever and chills. She was seen here last night but is no better.

## 2017-07-12 LAB — CBC WITH DIFFERENTIAL/PLATELET
BASOS PCT: 0 %
Basophils Absolute: 0 10*3/uL (ref 0.0–0.1)
Eosinophils Absolute: 0.1 10*3/uL (ref 0.0–0.7)
Eosinophils Relative: 1 %
HEMATOCRIT: 37.3 % (ref 36.0–46.0)
HEMOGLOBIN: 12.3 g/dL (ref 12.0–15.0)
LYMPHS PCT: 25 %
Lymphs Abs: 2.5 10*3/uL (ref 0.7–4.0)
MCH: 27.8 pg (ref 26.0–34.0)
MCHC: 33 g/dL (ref 30.0–36.0)
MCV: 84.2 fL (ref 78.0–100.0)
MONO ABS: 0.8 10*3/uL (ref 0.1–1.0)
MONOS PCT: 8 %
NEUTROS ABS: 6.6 10*3/uL (ref 1.7–7.7)
NEUTROS PCT: 66 %
Platelets: 202 10*3/uL (ref 150–400)
RBC: 4.43 MIL/uL (ref 3.87–5.11)
RDW: 14.2 % (ref 11.5–15.5)
WBC: 10 10*3/uL (ref 4.0–10.5)

## 2017-07-12 LAB — BASIC METABOLIC PANEL
ANION GAP: 7 (ref 5–15)
BUN: 10 mg/dL (ref 6–20)
CHLORIDE: 100 mmol/L — AB (ref 101–111)
CO2: 25 mmol/L (ref 22–32)
CREATININE: 0.99 mg/dL (ref 0.44–1.00)
Calcium: 8.4 mg/dL — ABNORMAL LOW (ref 8.9–10.3)
GFR calc non Af Amer: >60 mL/min (ref >60)
GLUCOSE: 106 mg/dL — AB (ref 65–99)
Potassium: 4.5 mmol/L (ref 3.5–5.1)
Sodium: 132 mmol/L — ABNORMAL LOW (ref 135–145)

## 2017-07-12 MED ORDER — TRAMADOL HCL 50 MG PO TABS: 50.0000 mg | ORAL_TABLET | Freq: Four times a day (QID) | ORAL | 0 refills | Status: AC | PRN

## 2017-07-12 NOTE — ED Notes (Signed)
Pt. Vomited approx. 372mls yellow bile while RN giving meds to Pt.

## 2017-09-23 ENCOUNTER — Emergency Department (HOSPITAL_BASED_OUTPATIENT_CLINIC_OR_DEPARTMENT_OTHER)
Admission: EM | Admit: 2017-09-23 | Discharge: 2017-09-23 | Disposition: A | Discharge status: Home / Self Care | Payer: Self-pay | Attending: Emergency Medicine | Admitting: Emergency Medicine

## 2017-09-23 ENCOUNTER — Encounter (HOSPITAL_BASED_OUTPATIENT_CLINIC_OR_DEPARTMENT_OTHER): Payer: Self-pay

## 2017-09-23 ENCOUNTER — Other Ambulatory Visit: Payer: Self-pay

## 2017-09-23 DIAGNOSIS — E039 Hypothyroidism, unspecified: Secondary | ICD-10-CM | POA: Insufficient documentation

## 2017-09-23 DIAGNOSIS — R1032 Left lower quadrant pain: Secondary | ICD-10-CM | POA: Insufficient documentation

## 2017-09-23 DIAGNOSIS — D219 Benign neoplasm of connective and other soft tissue, unspecified: Secondary | ICD-10-CM | POA: Insufficient documentation

## 2017-09-23 DIAGNOSIS — Z79899 Other long term (current) drug therapy: Secondary | ICD-10-CM | POA: Insufficient documentation

## 2017-09-23 LAB — URINALYSIS, ROUTINE W REFLEX MICROSCOPIC

## 2017-09-23 LAB — URINALYSIS, MICROSCOPIC (REFLEX): RBC / HPF: >50 RBC/hpf (ref 0–5)

## 2017-09-23 LAB — PREGNANCY, URINE: PREG TEST UR: NEGATIVE

## 2017-09-23 MED ORDER — KETOROLAC TROMETHAMINE 15 MG/ML IJ SOLN
15.0000 mg | Freq: Once | INTRAMUSCULAR | Status: AC
  Administered 2017-09-23: 15 mg via INTRAMUSCULAR
  Filled 2017-09-23: qty 1

## 2017-09-23 MED ORDER — CEPHALEXIN 500 MG PO CAPS: 500.0000 mg | ORAL_CAPSULE | Freq: Two times a day (BID) | ORAL | 0 refills | Status: AC

## 2017-09-23 MED FILL — CEPHALEXIN 500 MG CAPSULE: 500 | 5 days supply | Qty: 10 | Fill #0

## 2017-09-23 NOTE — ED Provider Notes (Signed)
[place] EMERGENCY DEPARTMENT Provider Note   CSN: [id number] Arrival date & time: 09/23/17  1150     History   Chief Complaint Chief Complaint  Patient presents with  . Pelvic Pain    HPI Destiny Hudson is a 36 y.o. female presenting for evaluation of left lower quadrant abdominal pain.  Patient states that while she was at work today, she developed sharp left lower quadrant pain.  She describes it as cramping.  She states this feels consistent with previous.  Cramps..  She has a history of endometriosis and fibroids, states that she often has monthly pain.  She took 800 mg of ibuprofen without improvement of her symptoms.  She is followed by OB/GYN, her yearly appointment is coming up.  She states this does not feel any more severe or different than previous episodes.  She denies fevers, chills, chest pain, shortness breath, nausea, vomiting, urinary symptoms, abnormal bowel movements.  She denies radiation of the pain.  It waxes and wanes.  Pain is not worse with bowel movement or urination.  No change with oral intake.  She has no other medical problems, does not take medications daily.  She denies vaginal discharge.  She is sexually active with one female partner, has no concerns for STDs at this time.  HPI  Past Medical History:  Diagnosis Date  . Bronchitis   . Endometritis   . Fibroids   . Obesity   . Thyroid disease    Hypo    There are no active problems to display for this patient.   Past Surgical History:  Procedure Laterality Date  . ABLATION     uterus  . DILATION AND CURETTAGE OF UTERUS    . EYE SURGERY       OB History   None      Home Medications    Prior to Admission medications   Medication Sig Start Date End Date Taking? Authorizing Provider  cephALEXin (KEFLEX) 500 MG capsule Take 1 capsule (500 mg total) by mouth 2 (two) times daily for 5 days. 09/23/17 09/28/17  Janecia Palau, PA-C  ibuprofen (ADVIL,MOTRIN) 600 MG tablet Take  1 tablet (600 mg total) by mouth every 6 (six) hours as needed. 07/11/17   Horton, Barbette Hair, MD  ibuprofen (ADVIL,MOTRIN) 800 MG tablet Take 1 tablet (800 mg total) by mouth every 8 (eight) hours as needed. 12/22/16   Long, Wonda Olds, MD  metroNIDAZOLE (FLAGYL) 500 MG tablet Take 1 tablet (500 mg total) by mouth 2 (two) times daily. 06/01/17   Volanda Napoleon, PA-C  naproxen (NAPROSYN) 375 MG tablet Take 1 tablet (375 mg total) by mouth 2 (two) times daily. 06/01/17   Volanda Napoleon, PA-C  ondansetron (ZOFRAN ODT) 4 MG disintegrating tablet Take 1 tablet (4 mg total) by mouth every 8 (eight) hours as needed for nausea or vomiting. 07/11/17   Horton, Barbette Hair, MD  ondansetron (ZOFRAN) 4 MG tablet Take 1 tablet (4 mg total) by mouth every 6 (six) hours. 06/01/17   Volanda Napoleon, PA-C  traMADol (ULTRAM) 50 MG tablet Take 1 tablet (50 mg total) by mouth every 6 (six) hours as needed. 07/12/17   Orpah Greek, MD    Family History No family history on file.  Social History Social History   Tobacco Use  . Smoking status: Never Smoker  . Smokeless tobacco: Never Used  Substance Use Topics  . Alcohol use: No  . Drug use: No  Allergies   Patient has no known allergies.   Review of Systems Review of Systems  Genitourinary: Positive for pelvic pain.  All other systems reviewed and are negative.    Physical Exam Updated Vital Signs BP (!) 147/102 (BP Location: Left Arm)   Pulse 84   Temp 99.4 F (37.4 C) (Oral)   Resp 16   Ht 5\' 10"  (1.778 m)   Wt (!) 140.6 kg (310 lb)   LMP 09/22/2017   SpO2 98%   BMI 44.48 kg/m   Physical Exam  Constitutional: She is oriented to person, place, and time. She appears well-developed and well-nourished. No distress.  Appears in no distress  HENT:  Head: Normocephalic and atraumatic.  Eyes: Pupils are equal, round, and reactive to light. Conjunctivae and EOM are normal.  Neck: Normal range of motion. Neck supple.    Cardiovascular: Normal rate, regular rhythm and intact distal pulses.  Pulmonary/Chest: Effort normal and breath sounds normal. No respiratory distress. She has no wheezes.  Abdominal: Soft. Bowel sounds are normal. She exhibits no distension and no mass. There is tenderness. There is no rebound and no guarding.  Mild tenderness palpation of left suprapubic abdomen.  No rebound.  Abdomen soft without rigidity, guarding, or distention.  No CVA tenderness.  Musculoskeletal: Normal range of motion.  Neurological: She is alert and oriented to person, place, and time.  Skin: Skin is warm and dry.  Psychiatric: She has a normal mood and affect.  Nursing note and vitals reviewed.    ED Treatments / Results  Labs (all labs ordered are listed, but only abnormal results are displayed) Labs Reviewed  URINALYSIS, ROUTINE W REFLEX MICROSCOPIC - Abnormal; Notable for the following components:      Result Value   Color, Urine RED (*)    APPearance TURBID (*)    Glucose, UA   (*)    Value: TEST NOT REPORTED DUE TO COLOR INTERFERENCE OF URINE PIGMENT   Hgb urine dipstick   (*)    Value: TEST NOT REPORTED DUE TO COLOR INTERFERENCE OF URINE PIGMENT   Bilirubin Urine   (*)    Value: TEST NOT REPORTED DUE TO COLOR INTERFERENCE OF URINE PIGMENT   Ketones, ur   (*)    Value: TEST NOT REPORTED DUE TO COLOR INTERFERENCE OF URINE PIGMENT   Protein, ur   (*)    Value: TEST NOT REPORTED DUE TO COLOR INTERFERENCE OF URINE PIGMENT   Nitrite   (*)    Value: TEST NOT REPORTED DUE TO COLOR INTERFERENCE OF URINE PIGMENT   Leukocytes, UA   (*)    Value: TEST NOT REPORTED DUE TO COLOR INTERFERENCE OF URINE PIGMENT   All other components within normal limits  URINALYSIS, MICROSCOPIC (REFLEX) - Abnormal; Notable for the following components:   Bacteria, UA MANY (*)    All other components within normal limits  PREGNANCY, URINE    EKG None  Radiology No results found.  Procedures Procedures (including  critical care time)  Medications Ordered in ED Medications  ketorolac (TORADOL) 15 MG/ML injection 15 mg (15 mg Intramuscular Given 09/23/17 1246)     Initial Impression / Assessment and Plan / ED Course  I have reviewed the triage vital signs and the nursing notes.  Pertinent labs & imaging results that were available during my care of the patient were reviewed by me and considered in my medical decision making (see chart for details).     Pt presenting for evaluation of  left suprapubic abdominal pain.  Physical examination, she is afebrile not tachycardic.  She appears nontoxic.  Patient reports pain is consistent with previous fibroid pain/menstrual cramping.  Patient denies history of kidney stones, no CVA tenderness.  Discussed with patient that there are other etiologies for similar pain including PID, torsion, ectopic, kidney stones.  Patient states she understands, but this feels similar to previous episodes.  Offered pelvic exam, patient states she will get a pelvic exam with her OB/GYN next week.  Will treat with Toradol and assess urine to rule out pregnancy and look for infection.  On reassessment, patient reports symptoms have resolved with Toradol.  Pregnancy negative.  Unable to assess urine for uti due to bleeding, but pt without sxs. Doubt uti.  Discussed with patient.  Discussed importance of follow-up with OB/GYN for further management of her symptoms, pelvic exam, STD testing, and reevaluation of symptoms.  At this time, patient appears safe for discharge.  Return precautions given.  Patient states she understands and agrees to plan.   Final Clinical Impressions(s) / ED Diagnoses   Final diagnoses:  Left lower quadrant pain  Fibroids    ED Discharge Orders        Ordered    cephALEXin (KEFLEX) 500 MG capsule  2 times daily     09/23/17 1336       [place], [place], PA-C 09/23/17 1510    Long, Wonda Olds, MD 09/23/17 1933

## 2017-09-23 NOTE — ED Triage Notes (Signed)
Pt c/o "fibroid pain" x today-NAD-steady gait

## 2017-09-23 NOTE — Discharge Instructions (Signed)
Take ibuprofen 3 times a day with meals.  Do not take other anti-inflammatories at the same time open (Advil, Motrin, naproxen, Aleve). You may supplement with Tylenol if you need further pain control. Use heating pads to help control your pain. If you start having urinary symptoms such as burning with urination or abnormal blood in your urine, start taking the antibiotics.  If you start, finish the entire course even if your symptoms resolved. It is very important that you follow-up with your OB/GYN next week for a pelvic exam and for reexamination of your symptoms. Return to the emergency room if you develop severe pain, fevers, vomiting, or any new or concerning symptoms.

## 2018-02-04 IMAGING — US US TRANSVAGINAL NON-OB
1 series · 13 of 25 positions shown · non-contrast
Comparison: None.

CLINICAL DATA: Left lower quadrant pain for 1 week, intermittent
with nausea. History of tubal ligation and endometrial a ablation
knee in 3965.

EXAM:
TRANSABDOMINAL AND TRANSVAGINAL ULTRASOUND OF PELVIS
DOPPLER ULTRASOUND OF OVARIES
TECHNIQUE: Both transabdominal and transvaginal ultrasound examinations of the
pelvis were performed. Transabdominal technique was performed for
global imaging of the pelvis including uterus, ovaries, adnexal
regions, and pelvic cul-de-sac.
It was necessary to proceed with endovaginal exam following the
transabdominal exam to visualize the endometrial complex and adnexal
structures to an adequate degree. Color and duplex Doppler
ultrasound was utilized to evaluate blood flow to the ovaries.

[Series 1: us transvaginal non-ob · 0.22mm/px · 119 acquisitions, 13 frames shown]
[im 1/119]
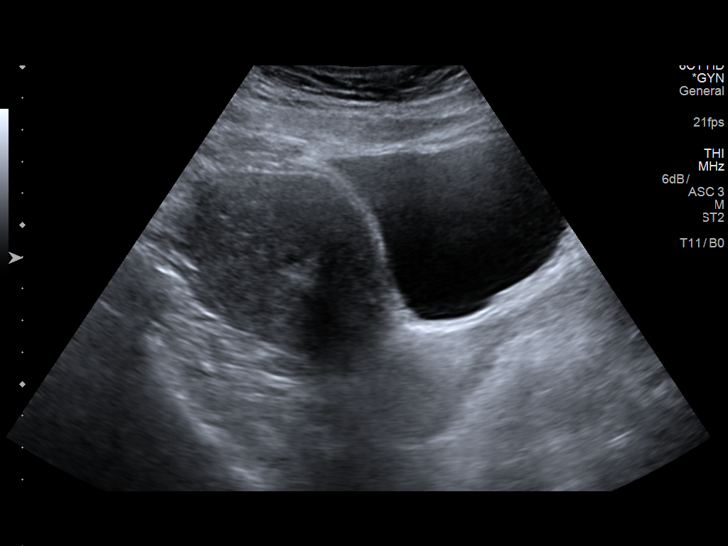
[im 10/119]
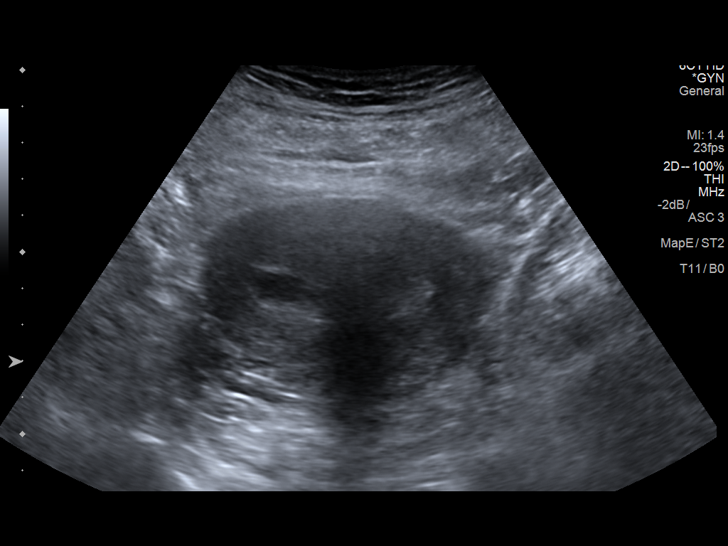
[im 20/119]
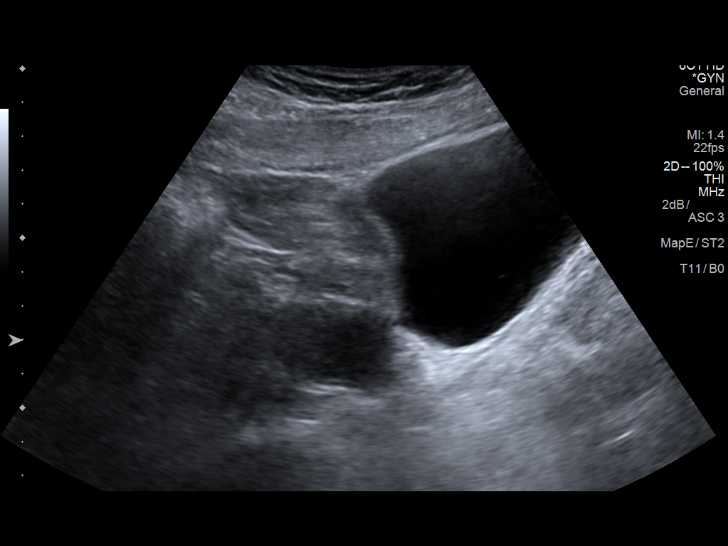
[im 30/119]
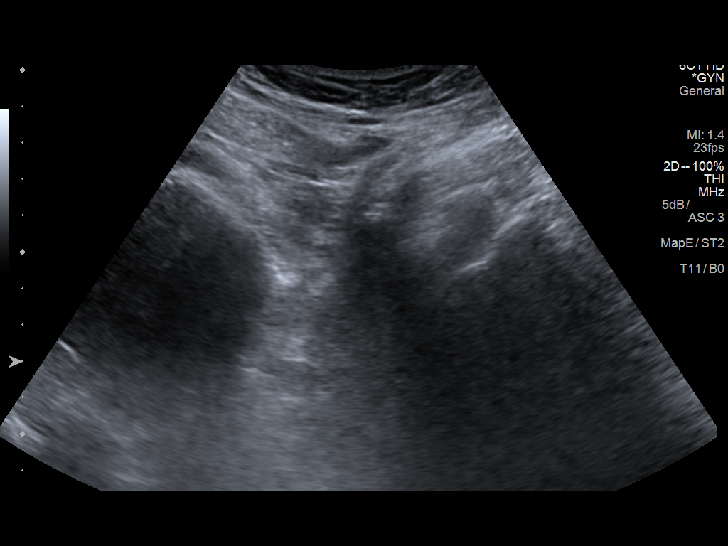
[im 40/119]
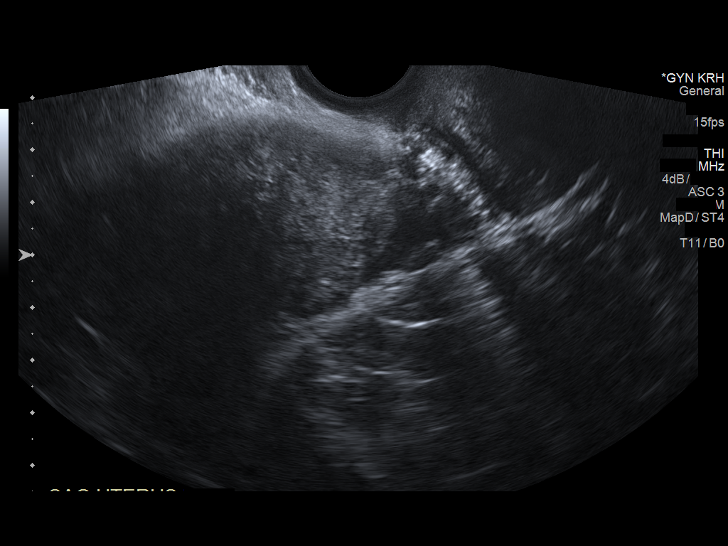
[im 50/119]
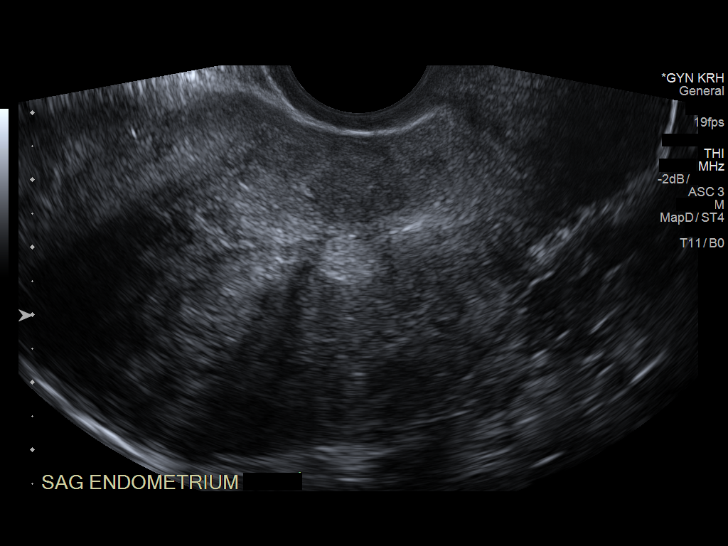
[im 60/119]
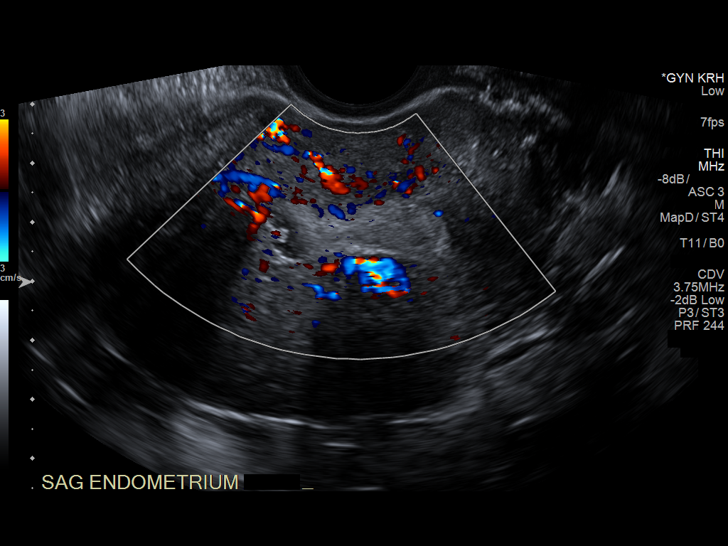
[im 69/119]
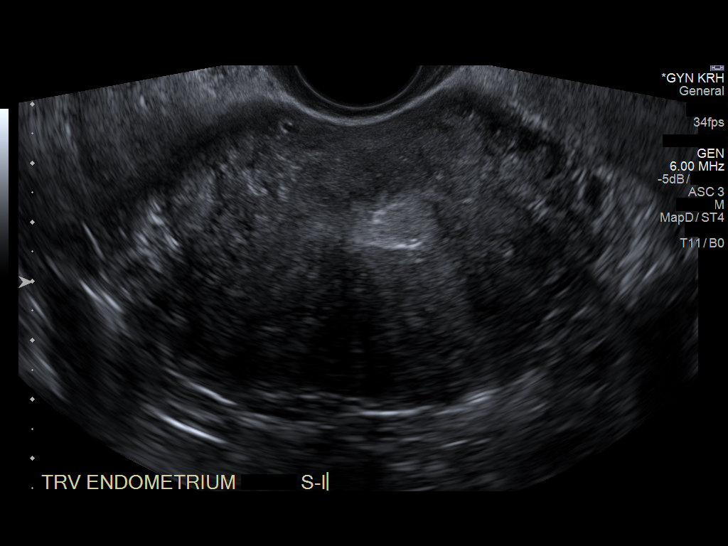
[im 79/119]
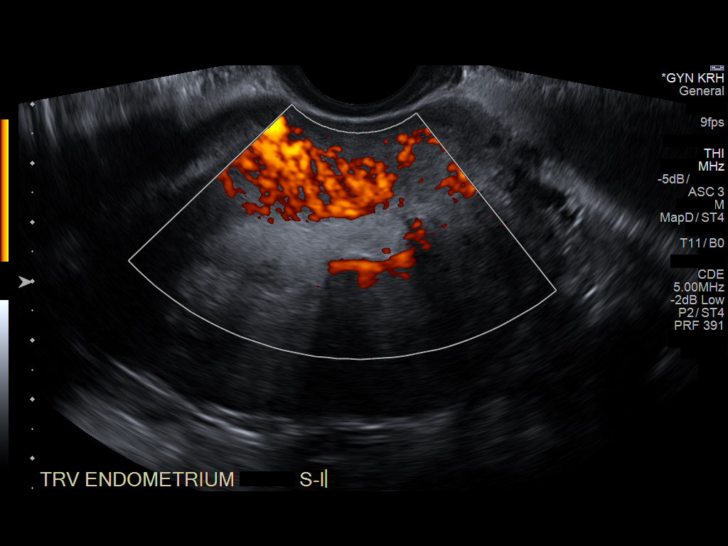
[im 89/119]
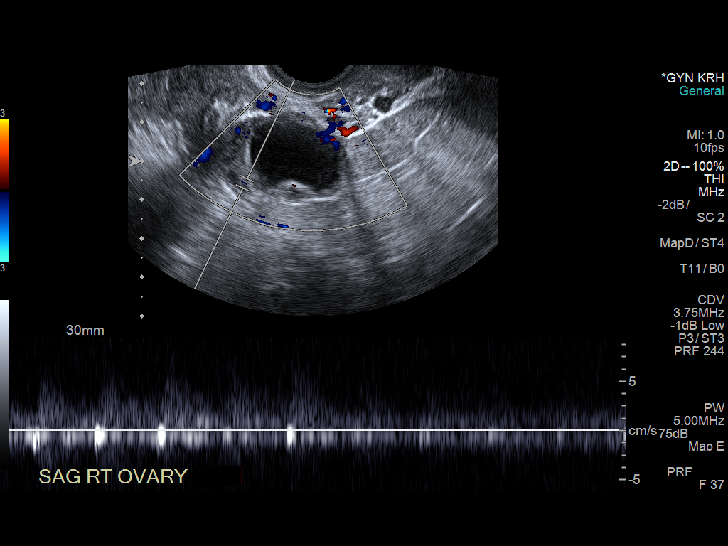
[im 99/119]
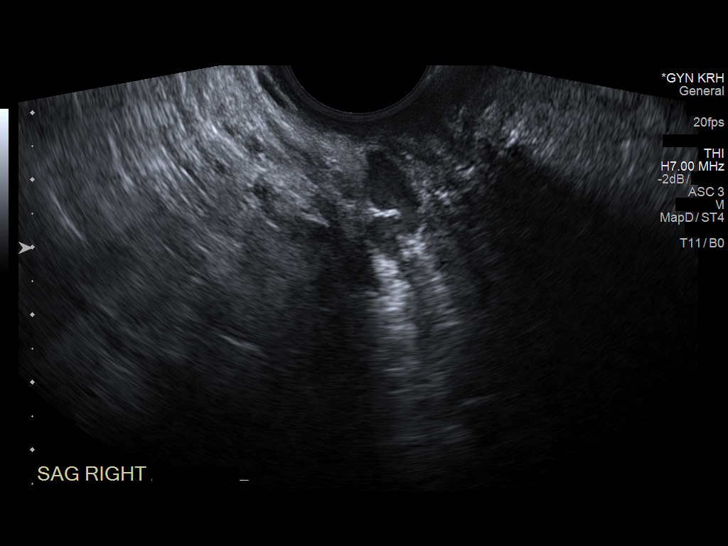
[im 109/119]
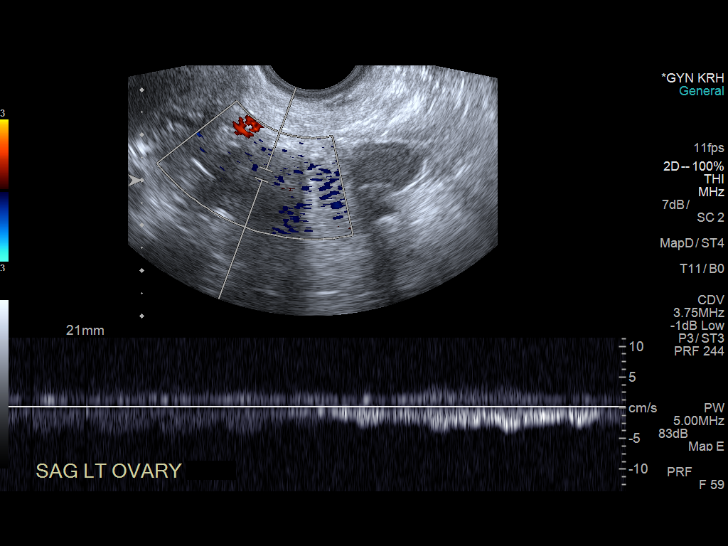
[im 119/119]
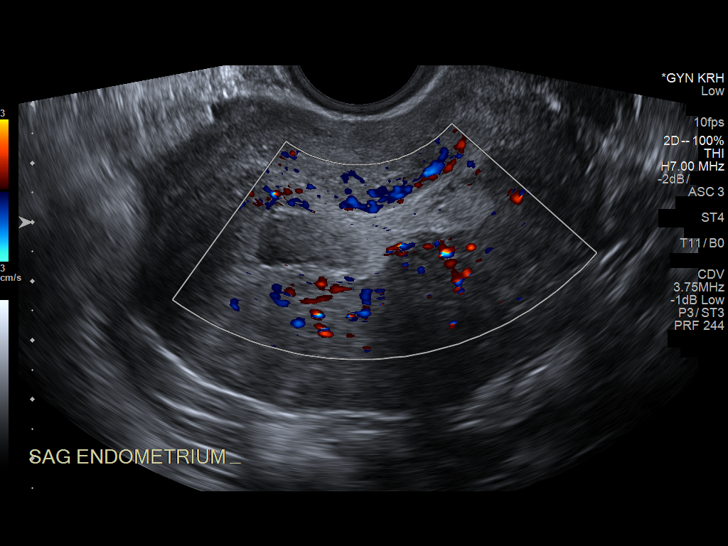

[13 of 25 positions shown; findings below may reference images not displayed]

FINDINGS: Uterus

Measurements: 10.8 x 5.9 x 8 cm. Heterogeneous echotexture
throughout without discrete mass or focal lesion.

Endometrium

Thickness: Approximately 7 mm thickness. Irregular margins without
discrete mass. At least some component of fluid and/or debris within
the endometrial canal.

Right ovary

Measurements: 3.9 x 2.5 x 2.8 cm. Normal dominant follicle measuring
2.6 cm.

Left ovary

Measurements: 2.4 x 1.4 x 2.1 cm. Normal appearance/no adnexal mass.

Pulsed Doppler evaluation of both ovaries demonstrates normal
low-resistance arterial and venous waveforms.

Other findings

No abnormal free fluid.
IMPRESSION: 1. Endometrial complex with irregular margins, but without discrete
mass, presumably related to history of previous endometrial
ablation. Small amount of fluid and/or debris within the upper
portion of the endometrial canal, again possibly residual from prior
ablation. Overall endometrial thickness is within normal limits at 7
mm. Would consider follow-up pelvic ultrasound in 2-3 months to
ensure stability.
2. Both ovaries appear normal and there is no mass or free fluid
seen within either adnexal region.

## 2018-12-27 IMAGING — US US PELV - US TRANSVAGINAL
1 series · 14 of 25 positions shown · non-contrast
Comparison: 07/10/2016

CLINICAL DATA: Right adnexal pain. History of fibroids and
endometrial ablation



[Series 1: us pelv - us transvaginal · 0.29mm/px · 14 of 44 slices shown]
[im 1/44]
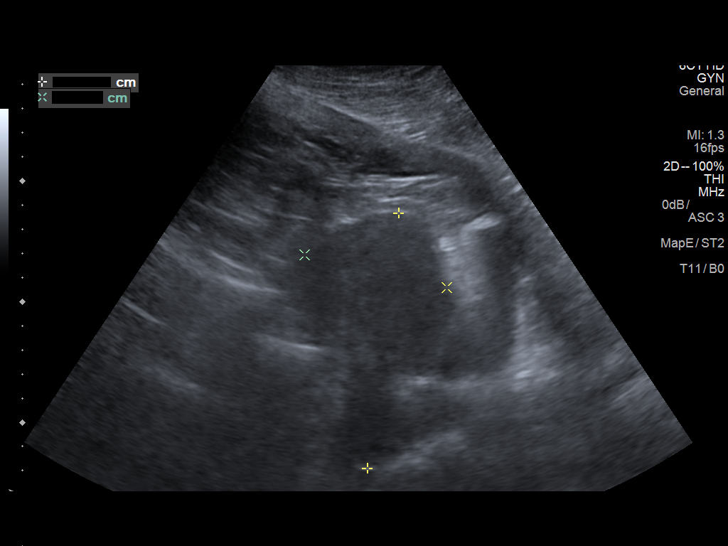
[im 4/44]
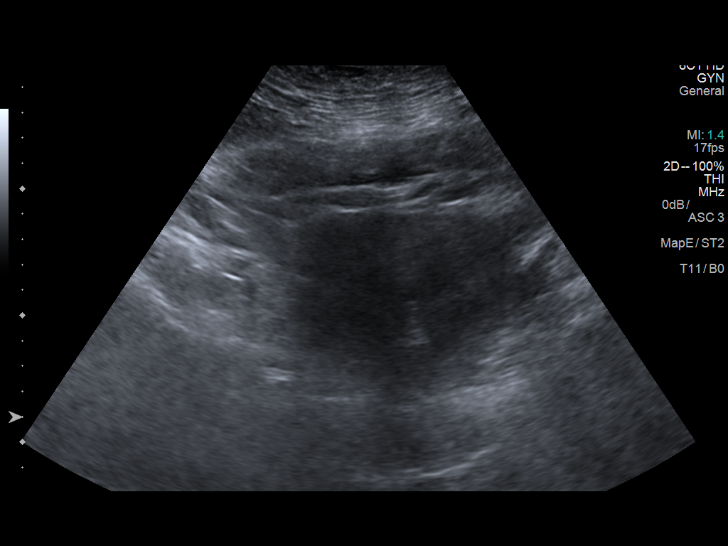
[im 8/44]
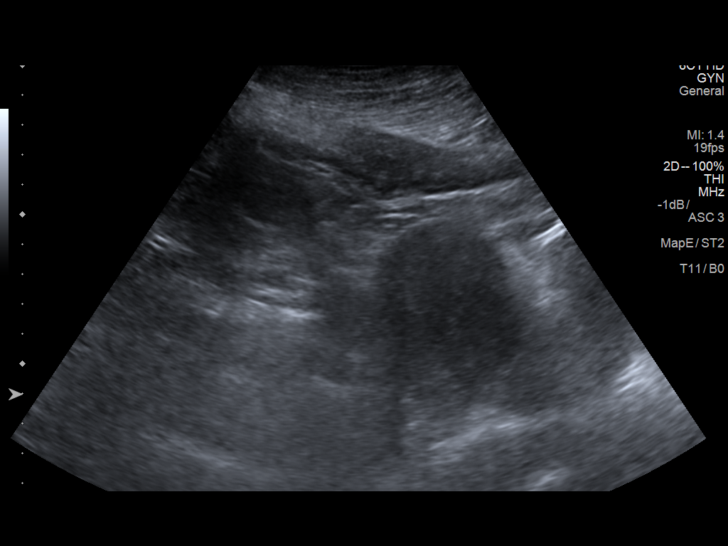
[im 11/44]
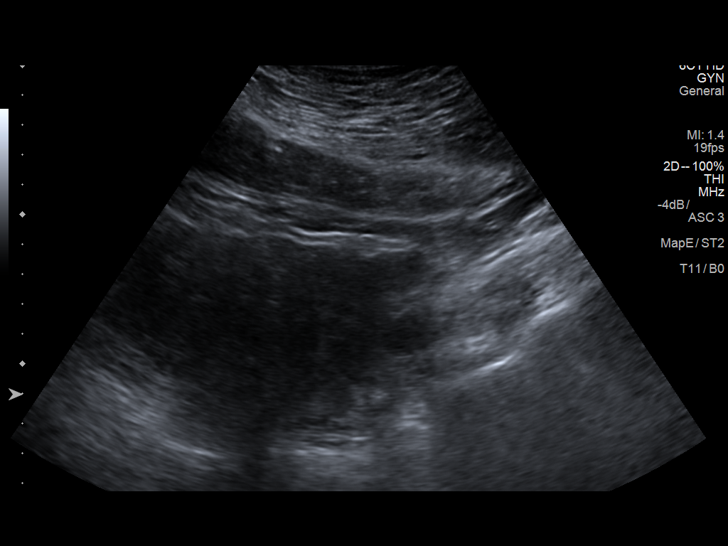
[im 15/44]
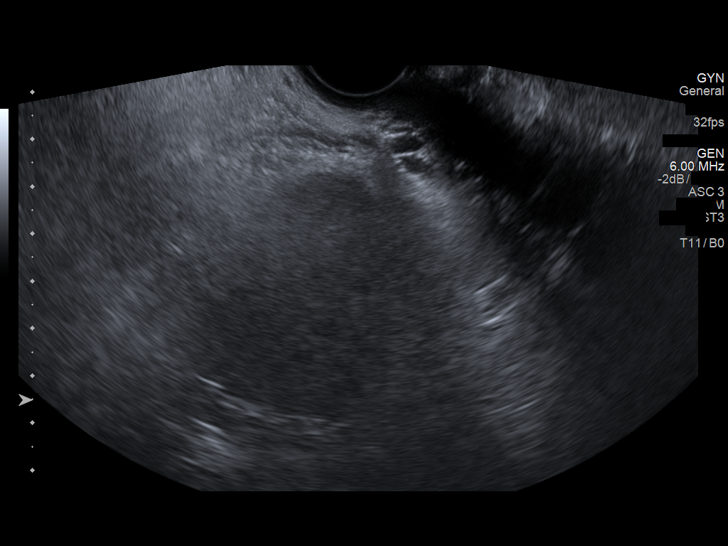
[im 17/44]
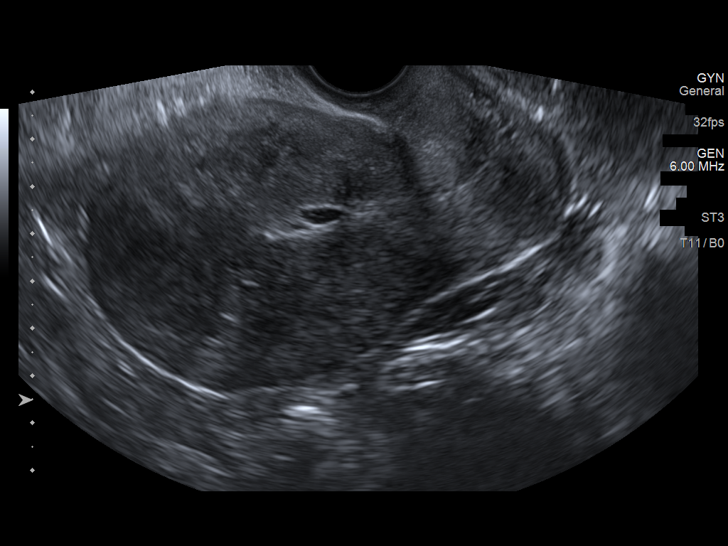
[im 20/44]
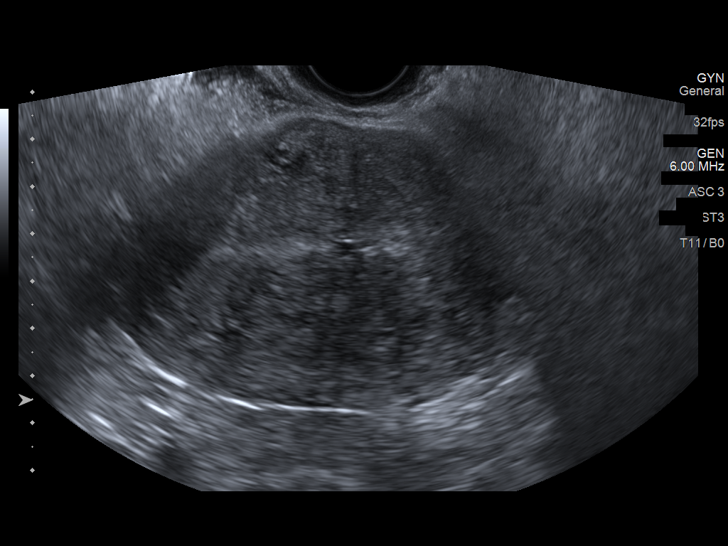
[im 24/44]
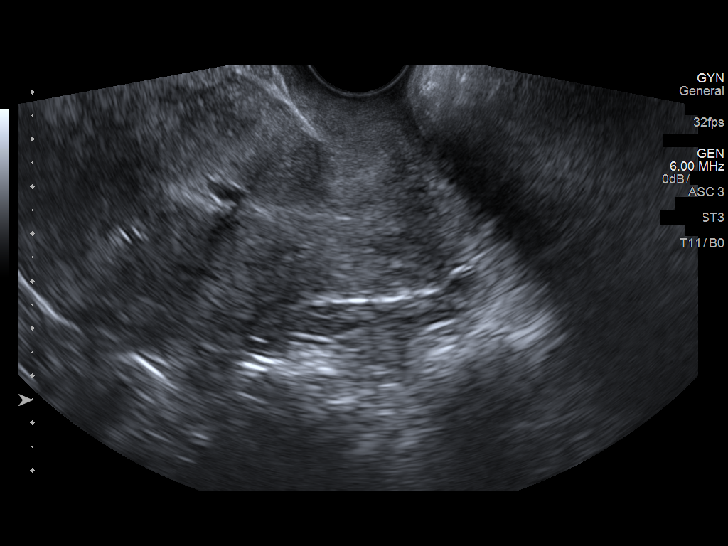
[im 27/44]
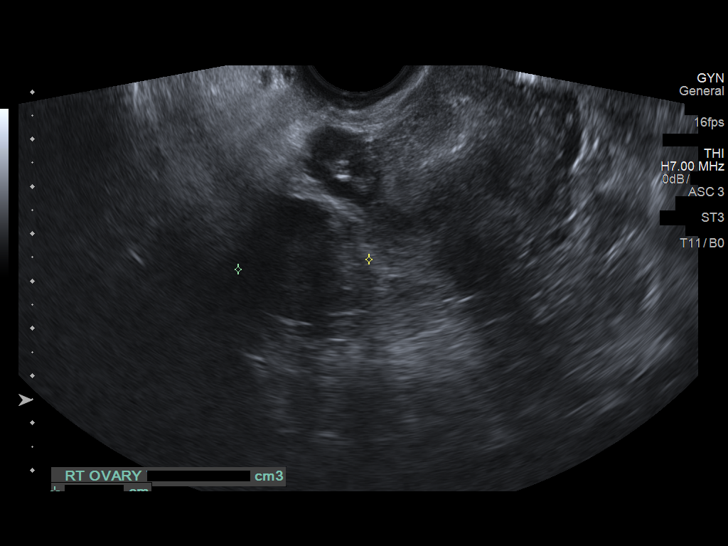
[im 29/44]
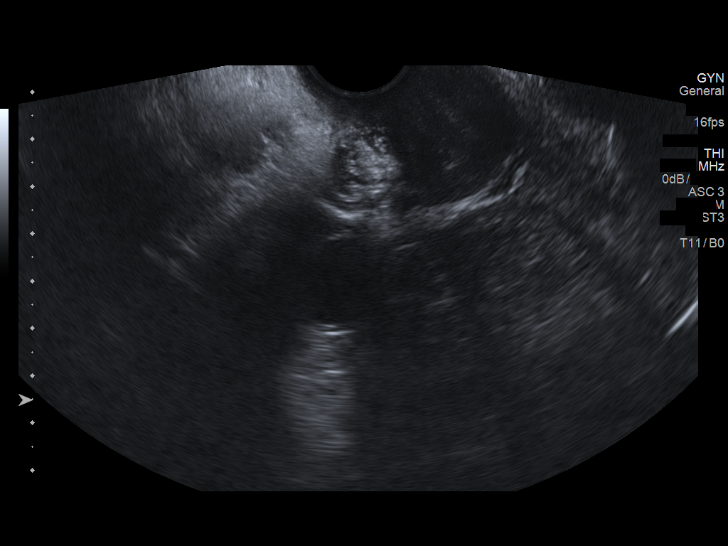
[im 33/44]
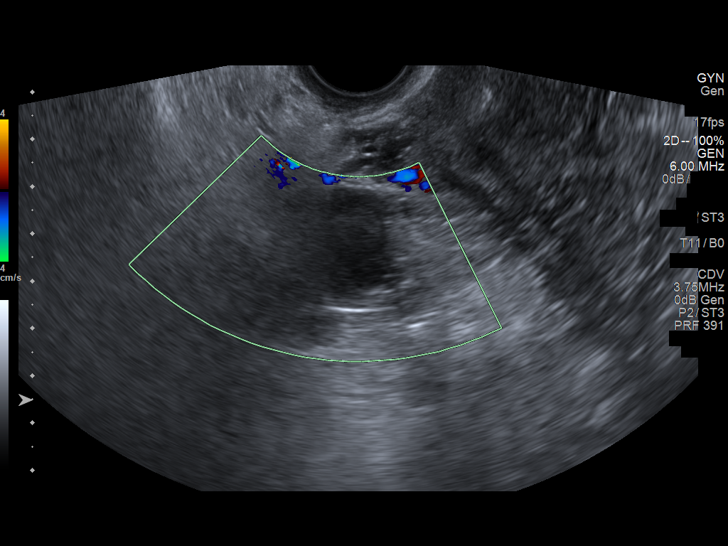
[im 36/44]
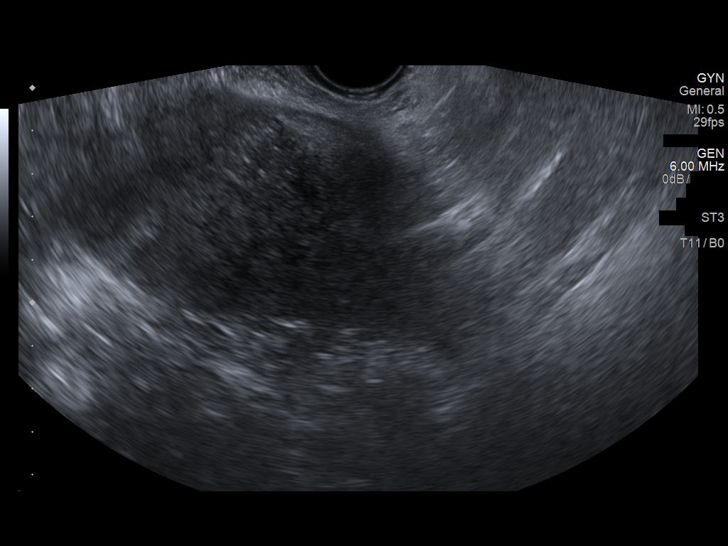
[im 40/44]
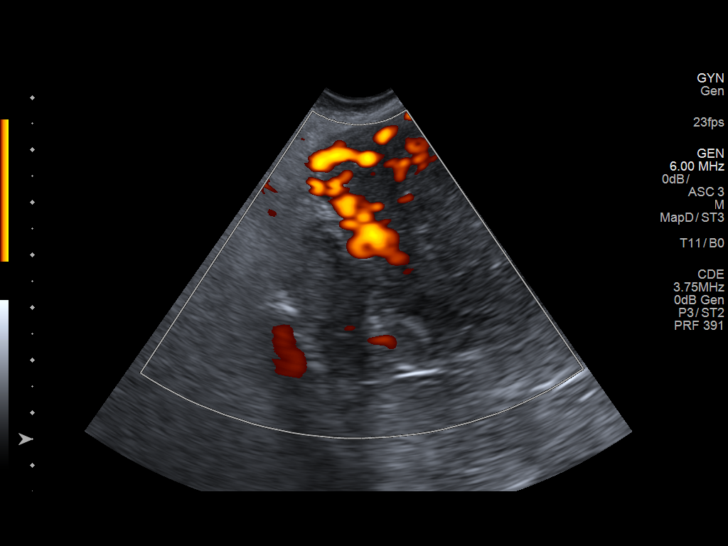
[im 44/44]
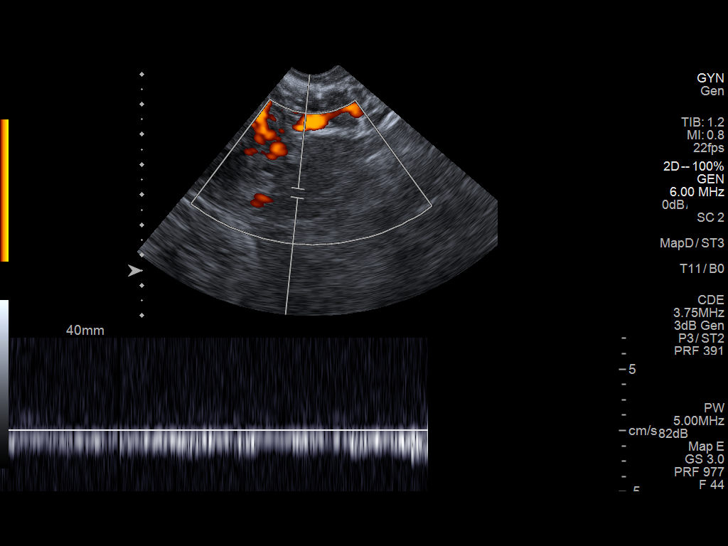

[14 of 25 positions shown; findings below may reference images not displayed]

FINDINGS: Uterus

Measurements: 10.3 x 5.9 x 8.1 cm. Heterogeneous echotexture without
measurable focal fibroid.

Endometrium

Thickness: 9 mm in thickness. 9 mm cystic area noted in the lower
uterine segment region.

Right ovary

Measurements: 4.6 x 3.0 x 2.8 cm. Normal appearance/no adnexal mass.

Left ovary

Measurements: Not visualized.  No adnexal mass seen.

Other findings

No abnormal free fluid.
IMPRESSION: Heterogeneous echotexture throughout the uterus without measurable
focal fibroid.

No acute findings.

## 2019-04-05 IMAGING — CT CT HEAD W/O CM
3 series · 16 of 47 positions shown, 19 images · non-contrast
Comparison: None.

CLINICAL DATA: Acute headache.

EXAM:
CT HEAD WITHOUT CONTRAST
TECHNIQUE: Contiguous axial images were obtained from the base of the skull
through the vertex without intravenous contrast.

[Series 2: head wo · axial · 0.42mm/px · z∈[-209,-74]mm · 10 of 33 slices shown, 13 images]
[im 3/33  brain]
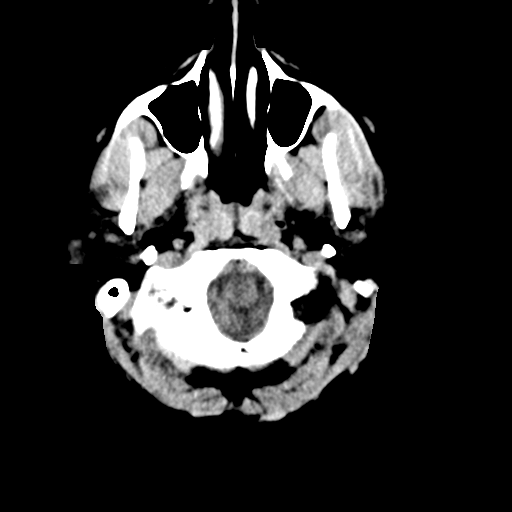
[im 3/33  bone]
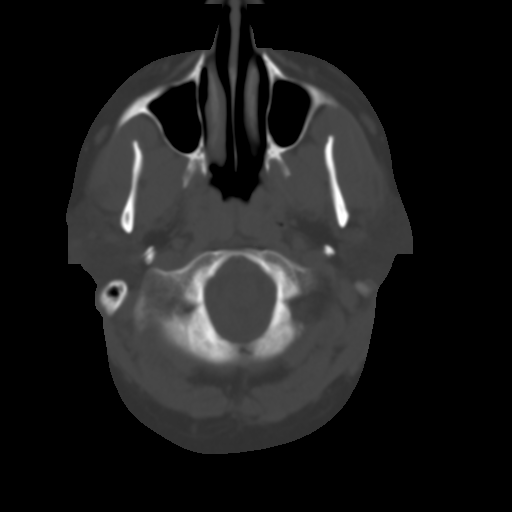
[im 6/33  brain]
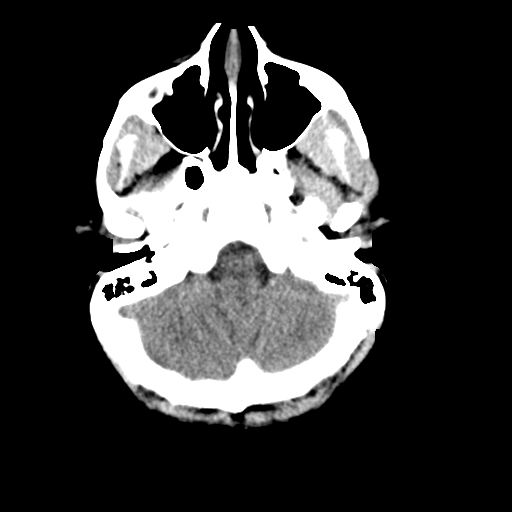
[im 9/33  brain]
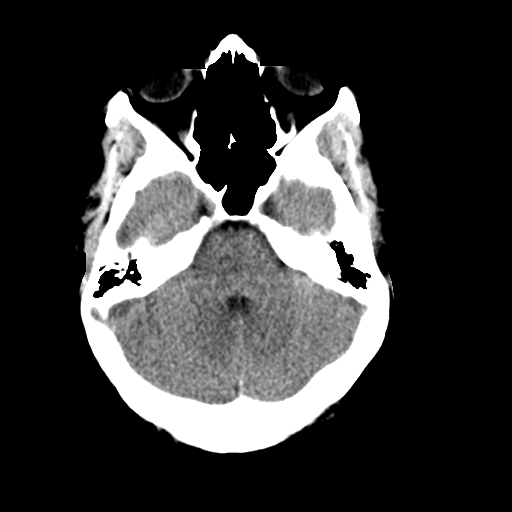
[im 12/33  brain]
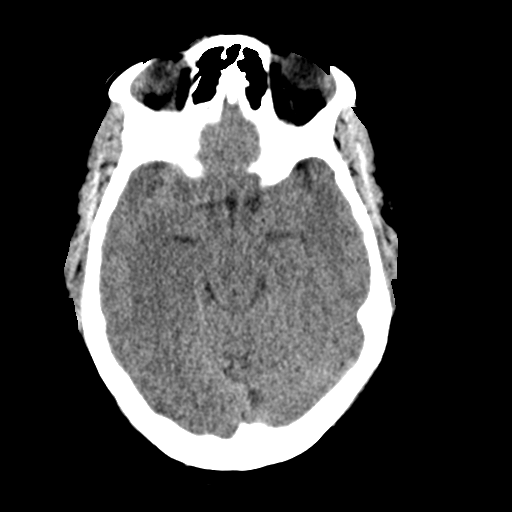
[im 15/33  brain]
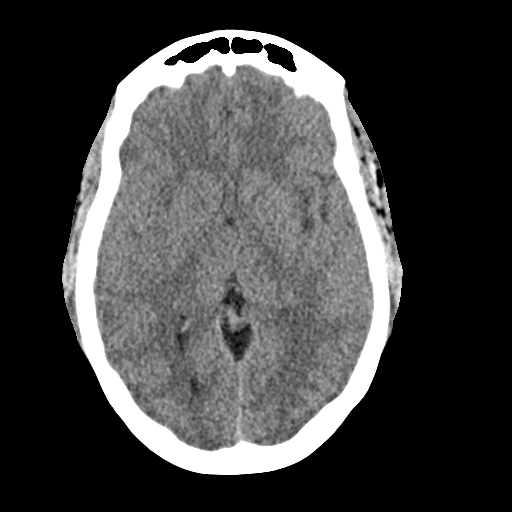
[im 15/33  bone]
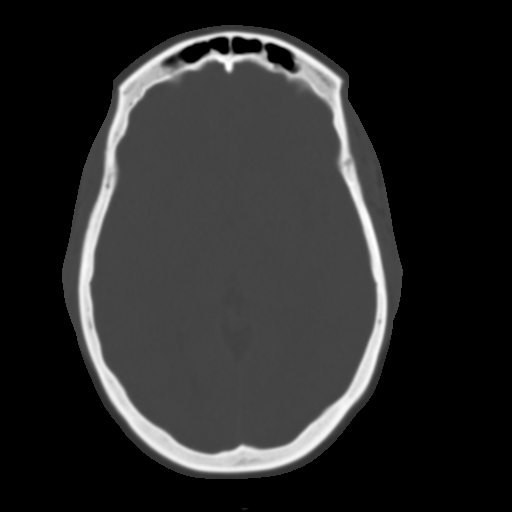
[im 18/33  brain]
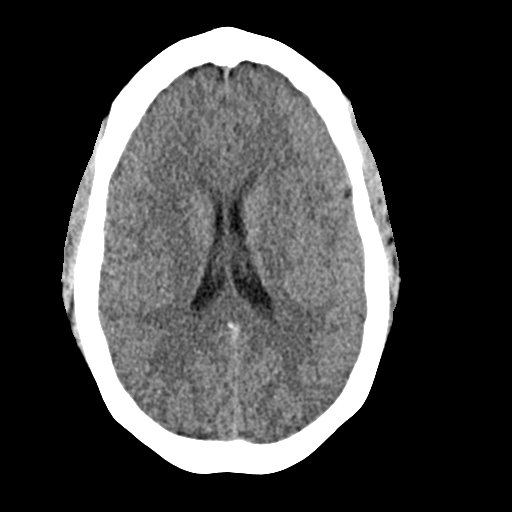
[im 21/33  brain]
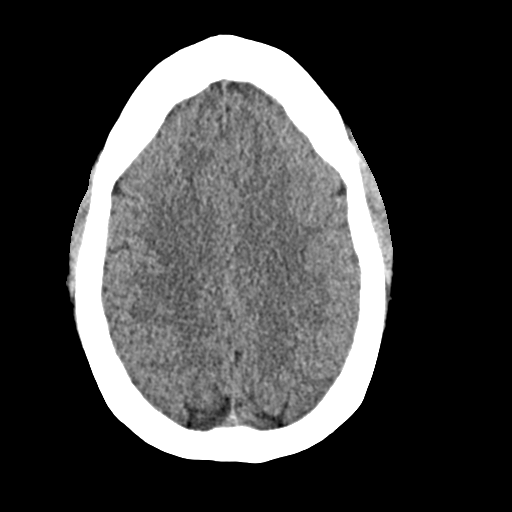
[im 25/33  brain]
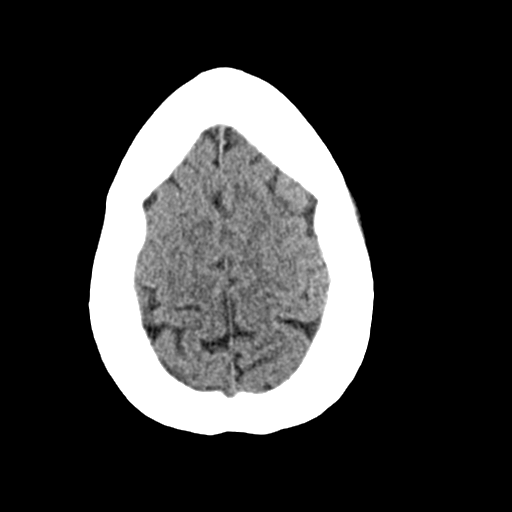
[im 27/33  brain]
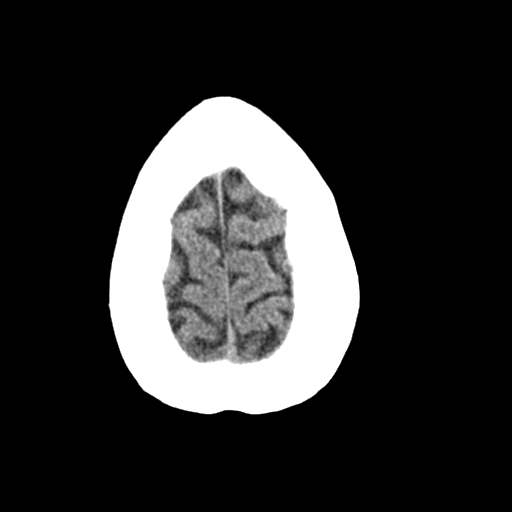
[im 27/33  bone]
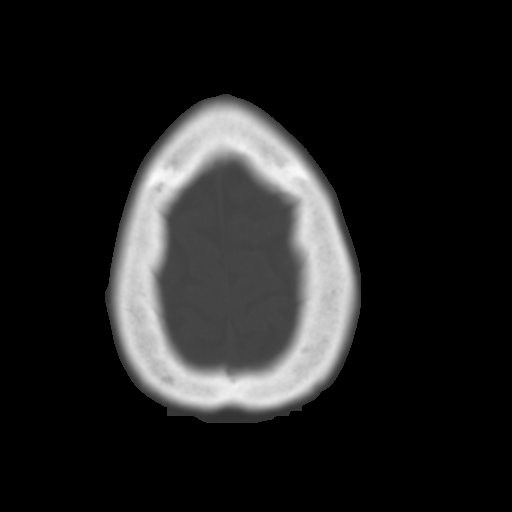
[im 30/33  brain]
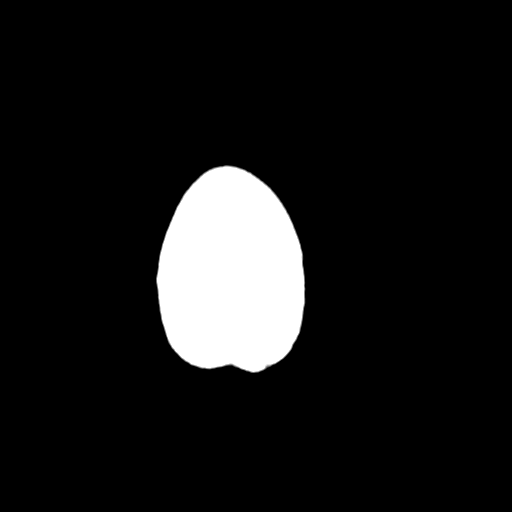

[Series 4: coronal soft · coronal · 0.31mm/px · 3 of 65 slices shown]
[im 22/65  brain]
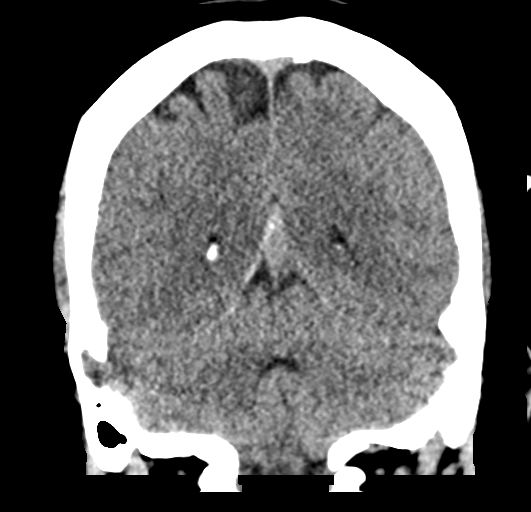
[im 29/65  brain]
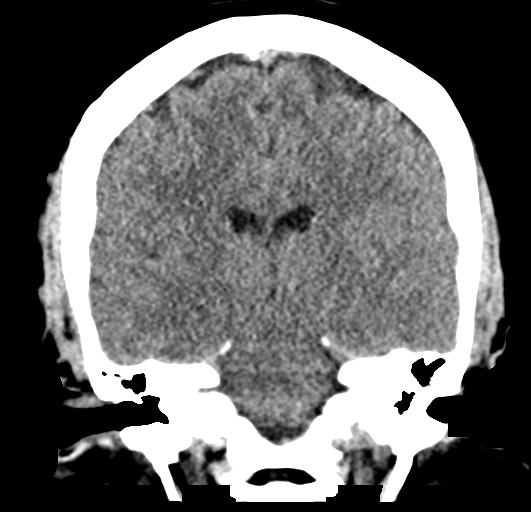
[im 36/65  brain]
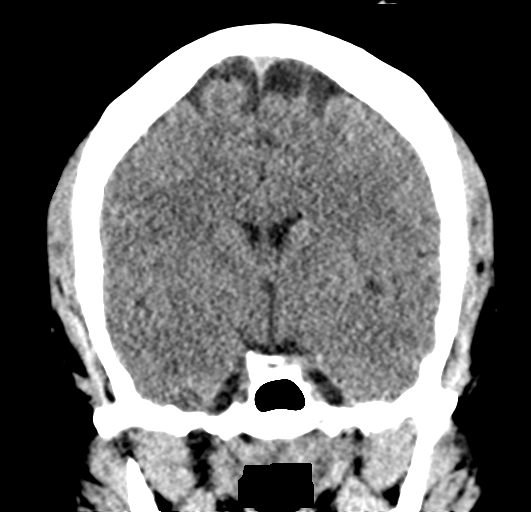

[Series 5: sag soft · sagittal · 0.30mm/px · 3 of 54 slices shown]
[im 18/54  brain]
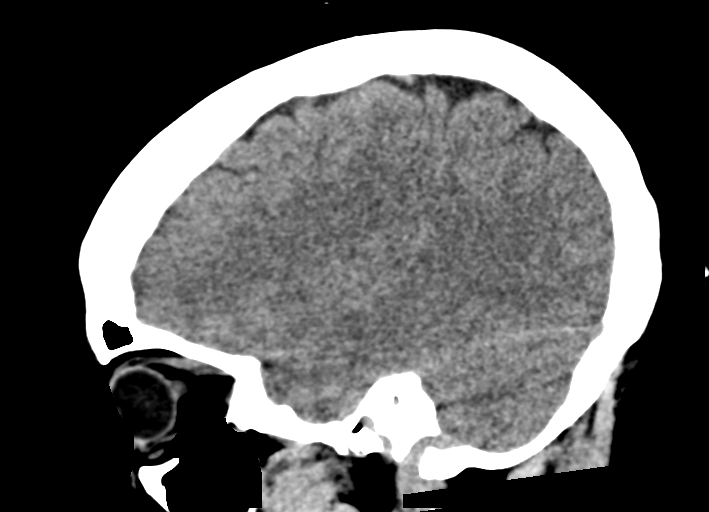
[im 27/54  brain]
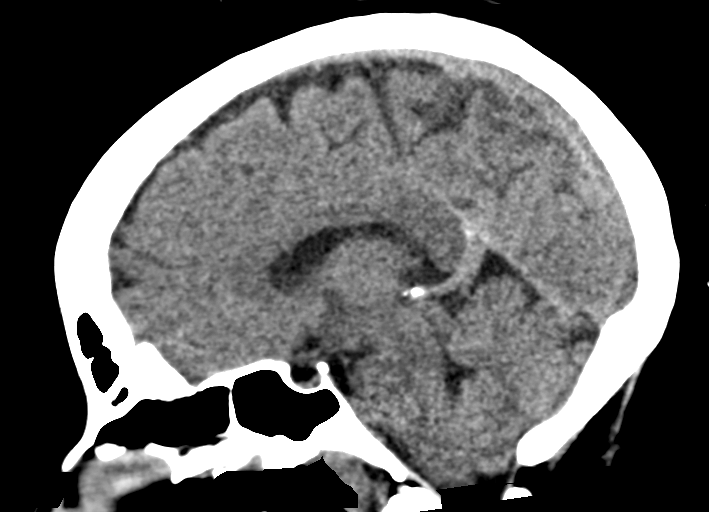
[im 36/54  brain]
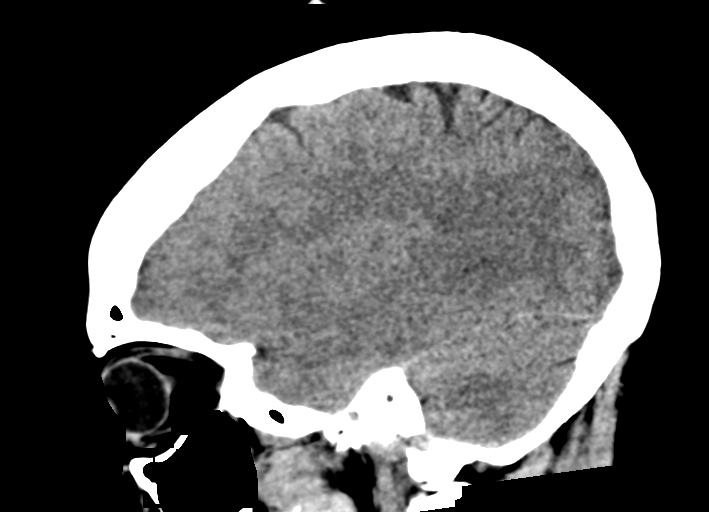

[16 of 47 positions shown; findings below may reference images not displayed]

FINDINGS: Brain: No intracranial hemorrhage, mass effect, or midline shift. No
hydrocephalus. The basilar cisterns are patent. No evidence of
territorial infarct or acute ischemia. No extra-axial or
intracranial fluid collection.

Vascular: No hyperdense vessel or unexpected calcification.

Skull: No fracture or focal lesion.

Sinuses/Orbits: Paranasal sinuses and mastoid air cells are clear.
The visualized orbits are unremarkable.

Other: None.
IMPRESSION: Unremarkable noncontrast head CT.

## 2021-09-04 ENCOUNTER — Emergency Department (INDEPENDENT_AMBULATORY_CARE_PROVIDER_SITE_OTHER)
Admission: RE | Admit: 2021-09-04 | Discharge: 2021-09-04 | Disposition: A | Discharge status: Home / Self Care | Payer: No Typology Code available for payment source | Source: Ambulatory Visit

## 2021-09-04 ENCOUNTER — Ambulatory Visit: Payer: Self-pay

## 2021-09-04 ENCOUNTER — Emergency Department (INDEPENDENT_AMBULATORY_CARE_PROVIDER_SITE_OTHER): Payer: No Typology Code available for payment source

## 2021-09-04 VITALS — BP 141/95 | HR 92 | Temp 98.3°F | Resp 18 | Ht 70.0 in | Wt 280.0 lb

## 2021-09-04 DIAGNOSIS — R0989 Other specified symptoms and signs involving the circulatory and respiratory systems: Secondary | ICD-10-CM

## 2021-09-04 DIAGNOSIS — R059 Cough, unspecified: Secondary | ICD-10-CM

## 2021-09-04 DIAGNOSIS — J40 Bronchitis, not specified as acute or chronic: Secondary | ICD-10-CM | POA: Diagnosis not present

## 2021-09-04 DIAGNOSIS — J329 Chronic sinusitis, unspecified: Secondary | ICD-10-CM

## 2021-09-04 DIAGNOSIS — R0981 Nasal congestion: Secondary | ICD-10-CM | POA: Diagnosis not present

## 2021-09-04 DIAGNOSIS — J309 Allergic rhinitis, unspecified: Secondary | ICD-10-CM

## 2021-09-04 LAB — POC SARS CORONAVIRUS 2 AG -  ED: SARS Coronavirus 2 Ag: NEGATIVE

## 2021-09-04 MED ORDER — BENZONATATE 200 MG PO CAPS: 200.0000 mg | ORAL_CAPSULE | Freq: Three times a day (TID) | ORAL | 0 refills | Status: AC | PRN

## 2021-09-04 MED ORDER — PREDNISONE 10 MG (21) PO TBPK: ORAL_TABLET | Freq: Every day | ORAL | 0 refills | Status: AC

## 2021-09-04 MED ORDER — METHYLPREDNISOLONE ACETATE 80 MG/ML IJ SUSP
80.0000 mg | Freq: Once | INTRAMUSCULAR | Status: AC
  Administered 2021-09-04: 80 mg via INTRAMUSCULAR

## 2021-09-04 MED ORDER — AMOXICILLIN-POT CLAVULANATE 875-125 MG PO TABS: 1.0000 | ORAL_TABLET | Freq: Two times a day (BID) | ORAL | 0 refills | Status: AC

## 2021-09-04 MED ORDER — FEXOFENADINE HCL 180 MG PO TABS
180.0000 mg | ORAL_TABLET | Freq: Every day | ORAL | 0 refills | Status: AC
Start: 2021-09-04 — End: 2021-09-19

## 2021-09-04 NOTE — ED Provider Notes (Signed)
[facility]    CSN: [id number] Arrival date & time: 09/04/21  0957      History   Chief Complaint Chief Complaint  Patient presents with   Cough    Entered by patient    HPI Destiny Hudson is a 41 y.o. female.   HPI 41 year old female presents with cough, shortness of breath, chest congestion, and nasal congestion for 2 weeks.  PMH significant for obesity, thyroid disease and bronchitis.  Past Medical History:  Diagnosis Date   Bronchitis    Endometritis    Fibroids    Obesity    Thyroid disease    Hypo    There are no problems to display for this patient.   Past Surgical History:  Procedure Laterality Date   ABLATION     uterus   DILATION AND CURETTAGE OF UTERUS     EYE SURGERY      OB History   No obstetric history on file.      Home Medications    Prior to Admission medications   Medication Sig Start Date End Date Taking? Authorizing Provider  amoxicillin-clavulanate (AUGMENTIN) 875-125 MG tablet Take 1 tablet by mouth 2 (two) times daily for 10 days. 09/04/21 09/14/21 Yes Eliezer Lofts, FNP  benzonatate (TESSALON) 200 MG capsule Take 1 capsule (200 mg total) by mouth 3 (three) times daily as needed for up to 7 days for cough. 09/04/21 09/11/21 Yes Eliezer Lofts, FNP  fexofenadine [facility] ALLERGY) 180 MG tablet Take 1 tablet (180 mg total) by mouth daily for 15 days. 09/04/21 09/19/21 Yes Eliezer Lofts, FNP  predniSONE (STERAPRED UNI-PAK 21 TAB) 10 MG (21) TBPK tablet Take by mouth daily. Take 6 tabs by mouth daily  for 2 days, then 5 tabs for 2 days, then 4 tabs for 2 days, then 3 tabs for 2 days, 2 tabs for 2 days, then 1 tab by mouth daily for 2 days 09/04/21  Yes Eliezer Lofts, FNP  ibuprofen (ADVIL,MOTRIN) 600 MG tablet Take 1 tablet (600 mg total) by mouth every 6 (six) hours as needed. 07/11/17   Horton, Barbette Hair, MD  ibuprofen (ADVIL,MOTRIN) 800 MG tablet Take 1 tablet (800 mg total) by mouth every 8 (eight) hours as needed. 12/22/16   Long,  Wonda Olds, MD  naproxen (NAPROSYN) 375 MG tablet Take 1 tablet (375 mg total) by mouth 2 (two) times daily. 06/01/17   Volanda Napoleon, PA-C  ondansetron (ZOFRAN ODT) 4 MG disintegrating tablet Take 1 tablet (4 mg total) by mouth every 8 (eight) hours as needed for nausea or vomiting. 07/11/17   Horton, Barbette Hair, MD  ondansetron (ZOFRAN) 4 MG tablet Take 1 tablet (4 mg total) by mouth every 6 (six) hours. 06/01/17   Volanda Napoleon, PA-C  traMADol (ULTRAM) 50 MG tablet Take 1 tablet (50 mg total) by mouth every 6 (six) hours as needed. 07/12/17   Orpah Greek, MD    Family History History reviewed. No pertinent family history.  Social History Social History   Tobacco Use   Smoking status: Never   Smokeless tobacco: Never  Substance Use Topics   Alcohol use: Yes    Comment: occ   Drug use: No     Allergies   Patient has no known allergies.   Review of Systems Review of Systems  HENT:  Positive for congestion, postnasal drip and sinus pressure.   Respiratory:  Positive for cough and shortness of breath.   All other systems reviewed and  are negative.   Physical Exam Triage Vital Signs ED Triage Vitals  Enc Vitals Group     BP 09/04/21 1015 (!) 141/95     Pulse Rate 09/04/21 1015 92     Resp 09/04/21 1015 18     Temp 09/04/21 1015 98.3 F (36.8 C)     Temp Source 09/04/21 1015 Oral     SpO2 09/04/21 1015 95 %     Weight 09/04/21 1010 280 lb (127 kg)     Height 09/04/21 1010 '5\' 10"'$  (1.778 m)     Head Circumference --      Peak Flow --      Pain Score 09/04/21 1010 0     Pain Loc --      Pain Edu? --      Excl. in [place]? --    No data found.  Updated Vital Signs BP (!) 141/95 (BP Location: Right Arm)   Pulse 92   Temp 98.3 F (36.8 C) (Oral)   Resp 18   Ht '5\' 10"'$  (1.778 m)   Wt 280 lb (127 kg)   SpO2 95%   BMI 40.18 kg/m       Physical Exam Vitals and nursing note reviewed.  Constitutional:      General: She is not in acute distress.     Appearance: Normal appearance. She is obese. She is not ill-appearing.  HENT:     Head: Normocephalic and atraumatic.     Right Ear: Tympanic membrane and external ear normal.     Left Ear: Tympanic membrane and external ear normal.     Ears:     Comments: Moderate eustachian tube dysfunction noted bilaterally    Nose:     Comments: Turbinates are erythematous/edematous    Mouth/Throat:     Mouth: Mucous membranes are moist.     Pharynx: Oropharynx is clear.     Comments: Significant amount of clear drainage of posterior oropharynx noted Eyes:     Extraocular Movements: Extraocular movements intact.     Conjunctiva/sclera: Conjunctivae normal.     Pupils: Pupils are equal, round, and reactive to light.  Cardiovascular:     Rate and Rhythm: Normal rate and regular rhythm.     Pulses: Normal pulses.     Heart sounds: Normal heart sounds. No murmur heard. Pulmonary:     Effort: Pulmonary effort is normal.     Breath sounds: No wheezing, rhonchi or rales.     Comments: Diminished breath sounds bibasilarly, frequent nonproductive cough noted on exam Musculoskeletal:     Cervical back: Normal range of motion and neck supple.  Skin:    General: Skin is warm and dry.  Neurological:     General: No focal deficit present.     Mental Status: She is alert and oriented to person, place, and time. Mental status is at baseline.     UC Treatments / Results  Labs (all labs ordered are listed, but only abnormal results are displayed) Labs Reviewed  POC SARS CORONAVIRUS 2 AG -  ED - Normal    EKG   Radiology DG Chest 2 View  Result Date: 09/04/2021 CLINICAL DATA:  Cough and congestion. EXAM: CHEST - 2 VIEW COMPARISON:  Chest two views 03/02/2011 FINDINGS: Cardiac silhouette and mediastinal contours are within normal limits. Mildly decreased lung volumes, similar to prior. The lungs are clear. No pleural effusion or mild-to-moderate multilevel disc space narrowing of the midthoracic spine.  No acute skeletal abnormality. IMPRESSION: No active  cardiopulmonary disease. Electronically Signed   By: Yvonne Kendall M.D.   On: 09/04/2021 10:52    Procedures Procedures (including critical care time)  Medications Ordered in UC Medications  methylPREDNISolone acetate (DEPO-MEDROL) injection 80 mg (80 mg Intramuscular Given 09/04/21 1153)    Initial Impression / Assessment and Plan / UC Course  I have reviewed the triage vital signs and the nursing notes.  Pertinent labs & imaging results that were available during my care of the patient were reviewed by me and considered in my medical decision making (see chart for details).     MDM: 1.  Sinobronchitis-Rx'd Augmentin; 2. Cough-IM Depo Medrol 80 mg given once in clinic prior to discharge, Rx'd Sterapred uni-pack, Tessalon Perles; 3.  Allergic rhinitis-Rx'd Allegra. Instructed patient to take medication as directed with food to completion.  Advised patient to take prednisone and Allegra with first dose of Augmentin for the next 5 of 10 days.  Advised may use Allegra as needed afterwards for concurrent postnasal drip/drainage.  Advised may use Tessalon Perles daily or as needed for cough.  Encouraged patient to increase daily water intake while taking these medications.  Advised patient if symptoms worsen and/or unresolved please follow-up with PCP or here for further evaluation. Final Clinical Impressions(s) / UC Diagnoses   Final diagnoses:  Cough, unspecified type  Sinobronchitis  Allergic rhinitis, unspecified seasonality, unspecified trigger     Discharge Instructions      Instructed patient to take medication as directed with food to completion.  Advised patient to take prednisone and Allegra with first dose of Augmentin for the next 5 of 10 days.  Advised may use Allegra as needed afterwards for concurrent postnasal drip/drainage.  Advised may use Tessalon Perles daily or as needed for cough.  Encouraged patient to increase daily  water intake while taking these medications.  Advised patient if symptoms worsen and/or unresolved please follow-up with PCP or here for further evaluation.     ED Prescriptions     Medication Sig Dispense Auth. Provider   amoxicillin-clavulanate (AUGMENTIN) 875-125 MG tablet Take 1 tablet by mouth 2 (two) times daily for 10 days. 20 tablet Eliezer Lofts, FNP   predniSONE (STERAPRED UNI-PAK 21 TAB) 10 MG (21) TBPK tablet Take by mouth daily. Take 6 tabs by mouth daily  for 2 days, then 5 tabs for 2 days, then 4 tabs for 2 days, then 3 tabs for 2 days, 2 tabs for 2 days, then 1 tab by mouth daily for 2 days 42 tablet Eliezer Lofts, FNP   fexofenadine [facility] ALLERGY) 180 MG tablet Take 1 tablet (180 mg total) by mouth daily for 15 days. 15 tablet Eliezer Lofts, FNP   benzonatate (TESSALON) 200 MG capsule Take 1 capsule (200 mg total) by mouth 3 (three) times daily as needed for up to 7 days for cough. 40 capsule Eliezer Lofts, FNP      PDMP not reviewed this encounter.   Eliezer Lofts, [place] 09/04/21 1223

## 2021-09-04 NOTE — Discharge Instructions (Addendum)
Instructed patient to take medication as directed with food to completion.  Advised patient to take prednisone and Allegra with first dose of Augmentin for the next 5 of 10 days.  Advised may use Allegra as needed afterwards for concurrent postnasal drip/drainage.  Advised may use Tessalon Perles daily or as needed for cough.  Encouraged patient to increase daily water intake while taking these medications.  Advised patient if symptoms worsen and/or unresolved please follow-up with PCP or here for further evaluation.

## 2021-09-04 NOTE — ED Triage Notes (Signed)
Pt states that she has a cough, sob, chest congestion, nasal congestion. X2 weeks   Pt states that she is vaccinated for covid. Pt states that she hasn't had flu vaccine.

## 2022-11-14 ENCOUNTER — Emergency Department (HOSPITAL_BASED_OUTPATIENT_CLINIC_OR_DEPARTMENT_OTHER)
Admission: EM | Admit: 2022-11-14 | Discharge: 2022-11-14 | Disposition: A | Discharge status: Home / Self Care | Payer: No Typology Code available for payment source | Attending: Emergency Medicine | Admitting: Emergency Medicine

## 2022-11-14 ENCOUNTER — Other Ambulatory Visit: Payer: Self-pay

## 2022-11-14 DIAGNOSIS — U071 COVID-19: Secondary | ICD-10-CM | POA: Diagnosis not present

## 2022-11-14 DIAGNOSIS — R509 Fever, unspecified: Secondary | ICD-10-CM | POA: Diagnosis present

## 2022-11-14 DIAGNOSIS — J069 Acute upper respiratory infection, unspecified: Secondary | ICD-10-CM | POA: Diagnosis not present

## 2022-11-14 LAB — RESP PANEL BY RT-PCR (RSV, FLU A&B, COVID)  RVPGX2
Influenza A by PCR: NEGATIVE
Influenza B by PCR: NEGATIVE
Resp Syncytial Virus by PCR: NEGATIVE
SARS Coronavirus 2 by RT PCR: POSITIVE — AB

## 2022-11-14 NOTE — ED Provider Notes (Signed)
[place] [facility] AT [facility]  Provider Note  CSN: [id number] Arrival date & time: 11/14/22 6387  History Chief Complaint  Patient presents with   Fever    Destiny Hudson is a 42 y.o. female reports 2 days of fever, congestion, general malaise and now dry cough. Temp up to 102F at home. Symptoms improved with OTC medications.    Home Medications Prior to Admission medications   Medication Sig Start Date End Date Taking? Authorizing Provider  fexofenadine (ALLEGRA ALLERGY) 180 MG tablet Take 1 tablet (180 mg total) by mouth daily for 15 days. 09/04/21 09/19/21  Trevor Iha, FNP  ibuprofen (ADVIL,MOTRIN) 600 MG tablet Take 1 tablet (600 mg total) by mouth every 6 (six) hours as needed. 07/11/17   Horton, Mayer Masker, MD  ibuprofen (ADVIL,MOTRIN) 800 MG tablet Take 1 tablet (800 mg total) by mouth every 8 (eight) hours as needed. 12/22/16   Long, Arlyss Repress, MD  naproxen (NAPROSYN) 375 MG tablet Take 1 tablet (375 mg total) by mouth 2 (two) times daily. 06/01/17   Maxwell Caul, PA-C  ondansetron (ZOFRAN ODT) 4 MG disintegrating tablet Take 1 tablet (4 mg total) by mouth every 8 (eight) hours as needed for nausea or vomiting. 07/11/17   Horton, Mayer Masker, MD  ondansetron (ZOFRAN) 4 MG tablet Take 1 tablet (4 mg total) by mouth every 6 (six) hours. 06/01/17   Maxwell Caul, PA-C  predniSONE (STERAPRED UNI-PAK 21 TAB) 10 MG (21) TBPK tablet Take by mouth daily. Take 6 tabs by mouth daily  for 2 days, then 5 tabs for 2 days, then 4 tabs for 2 days, then 3 tabs for 2 days, 2 tabs for 2 days, then 1 tab by mouth daily for 2 days 09/04/21   Trevor Iha, FNP  traMADol (ULTRAM) 50 MG tablet Take 1 tablet (50 mg total) by mouth every 6 (six) hours as needed. 07/12/17   Gilda Crease, MD     Allergies    Patient has no known allergies.   Review of Systems   Review of Systems Please see HPI for pertinent positives and negatives  Physical Exam BP 124/77    Pulse 99   Temp 98.5 F (36.9 C) (Oral)   Resp 19   Ht 5\' 8"  (1.727 m)   Wt 131.5 kg   SpO2 95%   BMI 44.09 kg/m   Physical Exam Vitals and nursing note reviewed.  Constitutional:      Appearance: Normal appearance.  HENT:     Head: Normocephalic and atraumatic.     Nose: Congestion present.     Mouth/Throat:     Mouth: Mucous membranes are moist.  Eyes:     Extraocular Movements: Extraocular movements intact.     Conjunctiva/sclera: Conjunctivae normal.  Cardiovascular:     Rate and Rhythm: Normal rate.  Pulmonary:     Effort: Pulmonary effort is normal.     Breath sounds: Normal breath sounds. No wheezing or rales.  Abdominal:     General: Abdomen is flat.     Palpations: Abdomen is soft.     Tenderness: There is no abdominal tenderness.  Musculoskeletal:        General: No swelling. Normal range of motion.     Cervical back: Neck supple.  Skin:    General: Skin is warm and dry.  Neurological:     General: No focal deficit present.     Mental Status: She is alert.  Psychiatric:  Mood and Affect: Mood normal.     ED Results / Procedures / Treatments   EKG None  Procedures Procedures  Medications Ordered in the ED Medications - No data to display  Initial Impression and Plan  Patient here with viral URI. Exam and vitals reassuring. Covid/Flu/RSV swab was done but patient will check results on her phone at home. Recommend she continue with symptomatic care, rest, hydration and isolation from family. PCP follow up, RTED for any other concerns.    ED Course       MDM Rules/Calculators/A&P Medical Decision Making Problems Addressed: Viral URI with cough: acute illness or injury  Amount and/or Complexity of Data Reviewed Labs: ordered.  Risk OTC drugs.     Final Clinical Impression(s) / ED Diagnoses Final diagnoses:  Viral URI with cough    Rx / DC Orders ED Discharge Orders     None        Pollyann Savoy, MD 11/14/22  [phone]

## 2022-11-14 NOTE — ED Triage Notes (Signed)
Pt states that she has had fatigue, congestion, fever, chills, cough, and headache x 2 days. Pt states highest temp at home was 102. Pt took Theraful with tylenol last at 0400.
